# Patient Record
Sex: Male | Born: 1970
Health system: Southern US, Community
[De-identification: ages and names within clinical notes are randomized; demographics above are authoritative.]

## PROBLEM LIST (undated history)

## (undated) DIAGNOSIS — E785 Hyperlipidemia, unspecified: Secondary | ICD-10-CM

## (undated) DIAGNOSIS — T7840XA Allergy, unspecified, initial encounter: Secondary | ICD-10-CM

## (undated) DIAGNOSIS — I10 Essential (primary) hypertension: Secondary | ICD-10-CM

## (undated) HISTORY — DX: Allergy, unspecified, initial encounter: T78.40XA

## (undated) HISTORY — DX: Essential (primary) hypertension: I10

## (undated) HISTORY — DX: Hyperlipidemia, unspecified: E78.5

---

## 2016-02-18 DIAGNOSIS — I10 Essential (primary) hypertension: Secondary | ICD-10-CM | POA: Diagnosis not present

## 2016-02-18 DIAGNOSIS — Z0000 Encounter for general adult medical examination without abnormal findings: Secondary | ICD-10-CM | POA: Diagnosis not present

## 2016-02-18 DIAGNOSIS — Z23 Encounter for immunization: Secondary | ICD-10-CM | POA: Diagnosis not present

## 2017-01-09 DIAGNOSIS — J0140 Acute pansinusitis, unspecified: Secondary | ICD-10-CM | POA: Diagnosis not present

## 2017-01-09 DIAGNOSIS — Z23 Encounter for immunization: Secondary | ICD-10-CM | POA: Diagnosis not present

## 2017-01-09 DIAGNOSIS — J069 Acute upper respiratory infection, unspecified: Secondary | ICD-10-CM | POA: Diagnosis not present

## 2017-10-17 DIAGNOSIS — I8392 Asymptomatic varicose veins of left lower extremity: Secondary | ICD-10-CM | POA: Diagnosis not present

## 2017-10-17 DIAGNOSIS — D171 Benign lipomatous neoplasm of skin and subcutaneous tissue of trunk: Secondary | ICD-10-CM | POA: Diagnosis not present

## 2017-10-18 ENCOUNTER — Other Ambulatory Visit: Payer: Self-pay

## 2017-10-18 DIAGNOSIS — I83812 Varicose veins of left lower extremities with pain: Secondary | ICD-10-CM

## 2017-12-27 DIAGNOSIS — R748 Abnormal levels of other serum enzymes: Secondary | ICD-10-CM | POA: Diagnosis not present

## 2017-12-27 DIAGNOSIS — Z0000 Encounter for general adult medical examination without abnormal findings: Secondary | ICD-10-CM | POA: Diagnosis not present

## 2017-12-27 DIAGNOSIS — E785 Hyperlipidemia, unspecified: Secondary | ICD-10-CM | POA: Diagnosis not present

## 2018-01-03 ENCOUNTER — Other Ambulatory Visit: Payer: Self-pay | Admitting: Family Medicine

## 2018-01-03 DIAGNOSIS — R748 Abnormal levels of other serum enzymes: Secondary | ICD-10-CM

## 2018-01-16 ENCOUNTER — Other Ambulatory Visit: Payer: Self-pay

## 2018-01-23 ENCOUNTER — Ambulatory Visit
Admission: RE | Admit: 2018-01-23 | Discharge: 2018-01-23 | Disposition: A | Discharge status: Home / Self Care | Payer: BLUE CROSS/BLUE SHIELD | Source: Ambulatory Visit | Attending: Family Medicine | Admitting: Family Medicine

## 2018-01-23 DIAGNOSIS — R7989 Other specified abnormal findings of blood chemistry: Secondary | ICD-10-CM | POA: Diagnosis not present

## 2018-01-23 DIAGNOSIS — R748 Abnormal levels of other serum enzymes: Secondary | ICD-10-CM

## 2018-01-28 ENCOUNTER — Encounter: Payer: Self-pay | Admitting: Surgery

## 2018-01-28 ENCOUNTER — Ambulatory Visit: Payer: BLUE CROSS/BLUE SHIELD | Admitting: Surgery

## 2018-01-28 ENCOUNTER — Other Ambulatory Visit: Payer: Self-pay

## 2018-01-28 ENCOUNTER — Ambulatory Visit (HOSPITAL_COMMUNITY)
Admission: RE | Admit: 2018-01-28 | Discharge: 2018-01-28 | Disposition: A | Discharge status: Home / Self Care | Payer: BLUE CROSS/BLUE SHIELD | Source: Ambulatory Visit | Attending: Surgery | Admitting: Surgery

## 2018-01-28 VITALS — BP 154/99 | HR 62 | Temp 97.6°F | Resp 16 | Ht 71.5 in | Wt 191.0 lb

## 2018-01-28 DIAGNOSIS — I83812 Varicose veins of left lower extremities with pain: Secondary | ICD-10-CM

## 2018-01-28 NOTE — Progress Notes (Signed)
[facility] [place]  Patient name: Robert Farmer MRN: [id number] DOB: 1970/07/29 Sex: male   REQUESTING PROVIDER:    Dr. Clarene Duke   REASON FOR CONSULT:    Varicose veins  HISTORY OF PRESENT ILLNESS:   Robert Farmer is a 47 y.o. male, who is here today for evaluation of varicose veins on his left leg.  The patient states that he has been having difficulty for approximately 1.5-2 years.  He has noticed the veins on his leg become more prominent and more tender.  He does endorse swelling in his left leg which is worse at the end of the day.  He has attempted to wear his mother's compression socks but has not noticed significant improvement.  He denies a history of DVT.  There is no family history of clotting disorder.  The patient is a non-smoker.  He is medically managed for hypertension with the beta-blocker.  He has hyperlipidemia which is not requiring medication.  PAST MEDICAL HISTORY    Past Medical History:  Diagnosis Date  . Allergy   . Hyperlipidemia   . Hypertension      FAMILY HISTORY   Family History  Problem Relation Age of Onset  . Stroke Mother     SOCIAL HISTORY:   Social History   Socioeconomic History  . Marital status: Unknown    Spouse name: Not on file  . Number of children: Not on file  . Years of education: Not on file  . Highest education level: Not on file  Occupational History  . Not on file  Social Needs  . Financial resource strain: Not on file  . Food insecurity:    Worry: Not on file    Inability: Not on file  . Transportation needs:    Medical: Not on file    Non-medical: Not on file  Tobacco Use  . Smoking status: Never Smoker  . Smokeless tobacco: Never Used  Substance and Sexual Activity  . Alcohol use: Yes  . Drug use: Never  . Sexual activity: Not on file  Lifestyle  . Physical activity:    Days per week: Not on file    Minutes per session: Not on file  .  Stress: Not on file  Relationships  . Social connections:    Talks on phone: Not on file    Gets together: Not on file    Attends religious service: Not on file    Active member of club or organization: Not on file    Attends meetings of clubs or organizations: Not on file    Relationship status: Not on file  . Intimate partner violence:    Fear of current or ex partner: Not on file    Emotionally abused: Not on file    Physically abused: Not on file    Forced sexual activity: Not on file  Other Topics Concern  . Not on file  Social History Narrative  . Not on file    ALLERGIES:    No Known Allergies  CURRENT MEDICATIONS:    Current Outpatient Medications  Medication Sig Dispense Refill  . ALPRAZolam (XANAX) 0.5 MG tablet Take 0.5 mg by mouth 2 (two) times daily as needed for anxiety.    . metoprolol succinate (TOPROL-XL) 50 MG 24 hr tablet TAKE 1 TABLET BY MOUTH ONCE DAILY FOR 90 DAYS  1  . triamcinolone cream (KENALOG) 0.5 % Apply 1 application topically 2 (two) times daily.     No current  facility-administered medications for this visit.     REVIEW OF SYSTEMS:   [X]  denotes positive finding, [ ]  denotes negative finding Cardiac  Comments:  Chest pain or chest pressure:    Shortness of breath upon exertion:    Short of breath when lying flat:    Irregular heart rhythm:        Vascular    Pain in calf, thigh, or hip brought on by ambulation:    Pain in feet at night that wakes you up from your sleep:     Blood clot in your veins:    Leg swelling:         Pulmonary    Oxygen at home:    Productive cough:     Wheezing:         Neurologic    Sudden weakness in arms or legs:     Sudden numbness in arms or legs:     Sudden onset of difficulty speaking or slurred speech:    Temporary loss of vision in one eye:     Problems with dizziness:         Gastrointestinal    Blood in stool:      Vomited blood:         Genitourinary    Burning when urinating:      Blood in urine:        Psychiatric    Major depression:         Hematologic    Bleeding problems:    Problems with blood clotting too easily:        Skin    Rashes or ulcers:        Constitutional    Fever or chills:     PHYSICAL EXAM:   Vitals:   01/28/18 1242  BP: (!) 154/99  Pulse: 62  Resp: 16  Temp: 97.6 F (36.4 C)  TempSrc: Oral  SpO2: 99%  Weight: 191 lb (86.6 kg)  Height: 5' 11.5" (1.816 m)    GENERAL: The patient is a well-nourished male, in no acute distress. The vital signs are documented above. CARDIAC: There is a regular rate and rhythm.  VASCULAR: Palpable pedal pulses.  Trace edema, left leg.  Prominent varicosities along the tributaries of the great saphenous vein. PULMONARY: Nonlabored respirations ABDOMEN: Soft and non-tender with normal pitched bowel sounds.  MUSCULOSKELETAL: There are no major deformities or cyanosis. NEUROLOGIC: No focal weakness or paresthesias are detected. SKIN: There are no ulcers or rashes noted. PSYCHIATRIC: The patient has a normal affect.  STUDIES:   I have ordered and reviewed his vascular lab studies with the following findings:  Venous Reflux Times Normal value < 0.5 sec +------------------------------+----------+---------+                Right (ms)Left (ms) +------------------------------+----------+---------+ CFV                   1555.00  +------------------------------+----------+---------+ FV                   1680.00  +------------------------------+----------+---------+ Popliteal                924.00   +------------------------------+----------+---------+ GSV at Saphenofemoral junction     3213.00  +------------------------------+----------+---------+ GSV prox thigh             2354.00  +------------------------------+----------+---------+ GSV mid thigh               2398.00  +------------------------------+----------+---------+ GSV dist thigh  1885.00  +------------------------------+----------+---------+ GSV at knee               2105.00  +------------------------------+----------+---------+ GSV prox calf              2178.00  +------------------------------+----------+---------+  Vein Diameters: +------------------------------+----------+---------+                Right (cm)Left (cm) +------------------------------+----------+---------+ GSV at Saphenofemoral junction     1.13    +------------------------------+----------+---------+ GSV at prox thigh            0.73    +------------------------------+----------+---------+ GSV at mid thigh            0.64    +------------------------------+----------+---------+ GSV at distal thigh           0.61    +------------------------------+----------+---------+ GSV at knee               0.60    +------------------------------+----------+---------+ GSV prox calf              0.63    +------------------------------+----------+---------+ GSV mid calf              0.69    +------------------------------+----------+---------+ SSV prox                0.33    +------------------------------+----------+---------+     Summary: Left: Abnormal reflux times were noted in the common femoral vein, femoral vein in the thigh, popliteal vein, great saphenous vein at the saphenofemoral junction, great saphenous vein at the proximal thigh, great saphenous vein at the mid thigh, great  saphenous vein at the distal thigh, great saphenous vein at the knee, and great saphenous vein at the proximal calf. There is no evidence of deep vein thrombosis in the lower extremity.There is no evidence of  superficial venous thrombosis.  ASSESSMENT and PLAN   CEAP class IV: I discussed that the initial treatment recommendations would be for him to wear 20-30 thigh-high compression stockings which he is going to go purchase today.  We also discussed the importance of leg elevation.  He understands to take anti-inflammatories for his pain.  He will be reevaluated in 3 months for consideration of laser vein ablation of the left great saphenous vein and stab phlebectomy of his varicosities.  Durene Cal, MD [facility] [phone] Pager [phone]

## 2018-05-02 ENCOUNTER — Ambulatory Visit: Payer: BLUE CROSS/BLUE SHIELD | Admitting: Vascular Surgery

## 2018-05-30 ENCOUNTER — Ambulatory Visit: Payer: BLUE CROSS/BLUE SHIELD | Admitting: Vascular Surgery

## 2018-06-06 ENCOUNTER — Ambulatory Visit (INDEPENDENT_AMBULATORY_CARE_PROVIDER_SITE_OTHER): Payer: BLUE CROSS/BLUE SHIELD | Admitting: Vascular Surgery

## 2018-06-06 ENCOUNTER — Encounter: Payer: Self-pay | Admitting: Vascular Surgery

## 2018-06-06 ENCOUNTER — Other Ambulatory Visit: Payer: Self-pay

## 2018-06-06 VITALS — BP 146/90 | HR 74 | Temp 97.2°F | Resp 18 | Ht 71.0 in | Wt 194.8 lb

## 2018-06-06 DIAGNOSIS — I83812 Varicose veins of left lower extremities with pain: Secondary | ICD-10-CM | POA: Diagnosis not present

## 2018-06-06 DIAGNOSIS — I872 Venous insufficiency (chronic) (peripheral): Secondary | ICD-10-CM | POA: Diagnosis not present

## 2018-06-06 NOTE — Progress Notes (Signed)
Patient name: Robert Farmer MRN: [id number] DOB: Mar 19, 1971 Sex: male  REASON FOR VISIT:   11-month follow-up visit.  HPI:   Robert Farmer is a pleasant 48 y.o. male who was seen by Dr. Myra Gianotti on 01/28/2018.  Patient had varicose veins of his left leg which were painful.  He also had some swelling which was worse at the end of the day.  He has had no previous history of DVT.  He was put in thigh-high compression stockings with a gradient of 20 to 30 mmHg,, encouraged to elevate his legs, and to take ibuprofen as needed for pain.  His venous duplex scan showed some deep venous reflux involving the common femoral vein, femoral vein, and popliteal vein.  He also had significant superficial venous reflux involving the entire left great saphenous vein.  On my history, the patient states that he has had progressive varicose veins of the left lower extremity over the last year and a half.  He experiences significant aching and heaviness in his legs.  The symptoms are aggravated by standing and sitting and relieved with elevation.  He does a lot of traveling and it experiences symptoms when he is traveling.  He has been wearing his compression stockings without significant relief of his symptoms.  He does try to elevate his legs.  He occasionally takes ibuprofen as needed for pain.  He is unaware of any previous history of DVT or phlebitis.  He is had no previous venous procedures.  He is otherwise very healthy.  Past Medical History:  Diagnosis Date  . Allergy   . Hyperlipidemia   . Hypertension     Family History  Problem Relation Age of Onset  . Stroke Mother     SOCIAL HISTORY: Social History   Tobacco Use  . Smoking status: Never Smoker  . Smokeless tobacco: Never Used  Substance Use Topics  . Alcohol use: Yes    No Known Allergies  Current Outpatient Medications  Medication Sig Dispense Refill  . ALPRAZolam (XANAX) 0.5 MG tablet Take 0.5 mg by mouth 2 (two) times daily as  needed for anxiety.    . metoprolol succinate (TOPROL-XL) 50 MG 24 hr tablet TAKE 1 TABLET BY MOUTH ONCE DAILY FOR 90 DAYS  1  . triamcinolone cream (KENALOG) 0.5 % Apply 1 application topically 2 (two) times daily.     No current facility-administered medications for this visit.     REVIEW OF SYSTEMS:   denotes positive finding,  denotes negative finding Cardiac  Comments:  Chest pain or chest pressure:    Shortness of breath upon exertion:    Short of breath when lying flat:    Irregular heart rhythm:        Vascular    Pain in calf, thigh, or hip brought on by ambulation:    Pain in feet at night that wakes you up from your sleep:     Blood clot in your veins:    Leg swelling:  x       Pulmonary    Oxygen at home:    Productive cough:     Wheezing:         Neurologic    Sudden weakness in arms or legs:     Sudden numbness in arms or legs:     Sudden onset of difficulty speaking or slurred speech:    Temporary loss of vision in one eye:     Problems with dizziness:  Gastrointestinal    Blood in stool:     Vomited blood:         Genitourinary    Burning when urinating:     Blood in urine:        Psychiatric    Major depression:         Hematologic    Bleeding problems:    Problems with blood clotting too easily:        Skin    Rashes or ulcers:        Constitutional    Fever or chills:     PHYSICAL EXAM:   Vitals:   06/06/18 1355  BP: (!) 146/90  Pulse: 74  Resp: 18  Temp: (!) 97.2 F (36.2 C)  TempSrc: Oral  SpO2: 98%  Weight: 194 lb 12.4 oz (88.3 kg)  Height: 5\' 11"  (1.803 m)    GENERAL: The patient is a well-nourished male, in no acute distress. The vital signs are documented above. CARDIAC: There is a regular rate and rhythm.  VASCULAR: I do not detect carotid bruits.  He has palpable popliteal, dorsalis pedis, and posterior tibial pulses bilaterally. VENOUS: He has a large cluster of varicose veins in his medial left calf and  also his posterior left calf. Currently he does not have significant swelling.  I do not see any significant hyperpigmentation. I did look at the left great saphenous vein myself with the SonoSite.  He has significant reflux from the saphenofemoral junction down to below the knee.  The vein becomes superficial below the knee and would likely have to be cannulated at the level of the knee.  There are large tributaries which come off of the great saphenous vein in the proximal calf. PULMONARY: There is good air exchange bilaterally without wheezing or rales. ABDOMEN: Soft and non-tender with normal pitched bowel sounds.  MUSCULOSKELETAL: There are no major deformities or cyanosis. NEUROLOGIC: No focal weakness or paresthesias are detected. SKIN: There are no ulcers or rashes noted. PSYCHIATRIC: The patient has a normal affect.  DATA:    VENOUS DUPLEX: I did review his previous deep venous duplex scan.  He had no evidence of DVT or superficial thrombophlebitis in the left leg.  He did have deep venous reflux involving the common femoral vein, femoral vein, and popliteal vein.  He had superficial venous reflux involving the great saphenous vein from the saphenofemoral junction down to the calf.  The vein is significantly dilated.  MEDICAL ISSUES:   CHRONIC VENOUS INSUFFICIENCY: This patient has CEAP C2 venous disease.  He has significant symptoms and is failed conservative treatment.  I think he is a good candidate for laser ablation of the left great saphenous vein and 10-20 stab phlebectomies.  In addition we have reviewed conservative measures for the treatment of venous disease.  Specifically we have discussed the importance of intermittent leg elevation and the proper positioning for this.  I encouraged him to wear his compression stockings.  I encouraged him to avoid prolonged sitting and standing.  We discussed the importance of exercise specifically walking and water aerobics which is very  helpful for patients with venous disease.  He had maintains a healthy weight which is certainly helpful.  He feels that his symptoms are significantly disabling would like to pursue further treatment.  For this reason we will schedule him for laser ablation of the left great saphenous vein and 10-20 stab phlebectomies.  I have discussed the indications for endovenous laser ablation of the left  GSV, that is to lower the pressure in the veins and potentially help relieve the symptoms from venous hypertension. I have also discussed alternative options including conservative treatment with leg elevation, compression therapy, exercise, avoiding prolonged sitting and standing, and weight management. I have discussed the potential complications of the procedure, including, but not limited to: bleeding, bruising, leg swelling, nerve injury, skin burns, significant pain from phlebitis, deep venous thrombosis, or failure of the vein to close.  I have also explained that venous insufficiency is a chronic disease, and that the patient is at risk for recurrent varicose veins in the future.  All of the patient's questions were encouraged and answered. They are agreeable to proceed.   I have discussed with the patient the indications for stab phlebectomy.  I have explained to the patient that that will have small scars from the stab incisions.  I explained that the other risks include leg swelling, bruising, bleeding, and phlebitis.  All the patient's questions were encouraged and answered and they are agreeable to proceed.  A total of 45 minutes was spent on this visit. 20 minutes was face to face time. More than 50% of the time was spent on counseling and coordinating with the patient.    Waverly Ferrari [facility] [phone]

## 2018-08-26 DIAGNOSIS — R509 Fever, unspecified: Secondary | ICD-10-CM | POA: Diagnosis not present

## 2019-03-05 DIAGNOSIS — J069 Acute upper respiratory infection, unspecified: Secondary | ICD-10-CM | POA: Diagnosis not present

## 2019-03-05 DIAGNOSIS — I10 Essential (primary) hypertension: Secondary | ICD-10-CM | POA: Diagnosis not present

## 2019-03-07 DIAGNOSIS — Z20828 Contact with and (suspected) exposure to other viral communicable diseases: Secondary | ICD-10-CM | POA: Diagnosis not present

## 2019-03-07 DIAGNOSIS — R0981 Nasal congestion: Secondary | ICD-10-CM | POA: Diagnosis not present

## 2019-03-19 DIAGNOSIS — N342 Other urethritis: Secondary | ICD-10-CM | POA: Diagnosis not present

## 2019-03-19 DIAGNOSIS — R509 Fever, unspecified: Secondary | ICD-10-CM | POA: Diagnosis not present

## 2019-03-19 DIAGNOSIS — R6889 Other general symptoms and signs: Secondary | ICD-10-CM | POA: Diagnosis not present

## 2019-03-20 DIAGNOSIS — N3000 Acute cystitis without hematuria: Secondary | ICD-10-CM | POA: Diagnosis not present

## 2019-03-20 DIAGNOSIS — Z20828 Contact with and (suspected) exposure to other viral communicable diseases: Secondary | ICD-10-CM | POA: Diagnosis not present

## 2019-03-20 DIAGNOSIS — R509 Fever, unspecified: Secondary | ICD-10-CM | POA: Diagnosis not present

## 2019-03-22 ENCOUNTER — Emergency Department (HOSPITAL_BASED_OUTPATIENT_CLINIC_OR_DEPARTMENT_OTHER): Payer: BC Managed Care – PPO

## 2019-03-22 ENCOUNTER — Encounter (HOSPITAL_BASED_OUTPATIENT_CLINIC_OR_DEPARTMENT_OTHER): Payer: Self-pay | Admitting: *Deleted

## 2019-03-22 ENCOUNTER — Other Ambulatory Visit: Payer: Self-pay

## 2019-03-22 ENCOUNTER — Inpatient Hospital Stay (HOSPITAL_BASED_OUTPATIENT_CLINIC_OR_DEPARTMENT_OTHER)
Admission: EM | Admit: 2019-03-22 | Discharge: 2019-03-28 | DRG: 872 | Disposition: A | Discharge status: Home Health | Payer: BC Managed Care – PPO | Attending: Internal Medicine | Admitting: Internal Medicine

## 2019-03-22 DIAGNOSIS — N390 Urinary tract infection, site not specified: Secondary | ICD-10-CM | POA: Diagnosis not present

## 2019-03-22 DIAGNOSIS — R519 Headache, unspecified: Secondary | ICD-10-CM | POA: Diagnosis present

## 2019-03-22 DIAGNOSIS — R7989 Other specified abnormal findings of blood chemistry: Secondary | ICD-10-CM | POA: Diagnosis not present

## 2019-03-22 DIAGNOSIS — B9620 Unspecified Escherichia coli [E. coli] as the cause of diseases classified elsewhere: Secondary | ICD-10-CM | POA: Diagnosis present

## 2019-03-22 DIAGNOSIS — A419 Sepsis, unspecified organism: Secondary | ICD-10-CM | POA: Diagnosis present

## 2019-03-22 DIAGNOSIS — F1010 Alcohol abuse, uncomplicated: Secondary | ICD-10-CM | POA: Diagnosis not present

## 2019-03-22 DIAGNOSIS — K760 Fatty (change of) liver, not elsewhere classified: Secondary | ICD-10-CM | POA: Diagnosis present

## 2019-03-22 DIAGNOSIS — N2889 Other specified disorders of kidney and ureter: Secondary | ICD-10-CM

## 2019-03-22 DIAGNOSIS — I10 Essential (primary) hypertension: Secondary | ICD-10-CM | POA: Diagnosis not present

## 2019-03-22 DIAGNOSIS — A4151 Sepsis due to Escherichia coli [E. coli]: Secondary | ICD-10-CM | POA: Diagnosis not present

## 2019-03-22 DIAGNOSIS — R509 Fever, unspecified: Secondary | ICD-10-CM | POA: Diagnosis not present

## 2019-03-22 DIAGNOSIS — N3090 Cystitis, unspecified without hematuria: Secondary | ICD-10-CM | POA: Diagnosis present

## 2019-03-22 DIAGNOSIS — R161 Splenomegaly, not elsewhere classified: Secondary | ICD-10-CM | POA: Diagnosis not present

## 2019-03-22 DIAGNOSIS — Z20828 Contact with and (suspected) exposure to other viral communicable diseases: Secondary | ICD-10-CM | POA: Diagnosis present

## 2019-03-22 DIAGNOSIS — E785 Hyperlipidemia, unspecified: Secondary | ICD-10-CM | POA: Diagnosis present

## 2019-03-22 DIAGNOSIS — Z1612 Extended spectrum beta lactamase (ESBL) resistance: Secondary | ICD-10-CM | POA: Diagnosis not present

## 2019-03-22 DIAGNOSIS — Z823 Family history of stroke: Secondary | ICD-10-CM

## 2019-03-22 DIAGNOSIS — N342 Other urethritis: Secondary | ICD-10-CM | POA: Diagnosis present

## 2019-03-22 DIAGNOSIS — Z79899 Other long term (current) drug therapy: Secondary | ICD-10-CM | POA: Diagnosis not present

## 2019-03-22 LAB — CBC WITH DIFFERENTIAL/PLATELET
Abs Immature Granulocytes: 0.03 10*3/uL (ref 0.00–0.07)
Basophils Absolute: 0.1 10*3/uL (ref 0.0–0.1)
Basophils Relative: 1 %
Eosinophils Absolute: 0 10*3/uL (ref 0.0–0.5)
Eosinophils Relative: 0 %
HCT: 42.8 % (ref 39.0–52.0)
Hemoglobin: 14.4 g/dL (ref 13.0–17.0)
Immature Granulocytes: 0 %
Lymphocytes Relative: 8 %
Lymphs Abs: 0.8 10*3/uL (ref 0.7–4.0)
MCH: 31.9 pg (ref 26.0–34.0)
MCHC: 33.6 g/dL (ref 30.0–36.0)
MCV: 94.7 fL (ref 80.0–100.0)
Monocytes Absolute: 1.8 10*3/uL — ABNORMAL HIGH (ref 0.1–1.0)
Monocytes Relative: 20 %
Neutro Abs: 6.6 10*3/uL (ref 1.7–7.7)
Neutrophils Relative %: 71 %
Platelets: 283 10*3/uL (ref 150–400)
RBC: 4.52 MIL/uL (ref 4.22–5.81)
RDW: 11.9 % (ref 11.5–15.5)
WBC: 9.4 10*3/uL (ref 4.0–10.5)
nRBC: 0 % (ref 0.0–0.2)

## 2019-03-22 LAB — URINALYSIS, ROUTINE W REFLEX MICROSCOPIC
Bilirubin Urine: NEGATIVE
Glucose, UA: NEGATIVE mg/dL
Ketones, ur: NEGATIVE mg/dL
Nitrite: POSITIVE — AB
Protein, ur: >300 mg/dL — AB
Specific Gravity, Urine: >1.03 — ABNORMAL HIGH (ref 1.005–1.030)
pH: 5.5 (ref 5.0–8.0)

## 2019-03-22 LAB — LACTIC ACID, PLASMA: Lactic Acid, Venous: 0.8 mmol/L (ref 0.5–1.9)

## 2019-03-22 LAB — COMPREHENSIVE METABOLIC PANEL
ALT: 126 U/L — ABNORMAL HIGH (ref 0–44)
AST: 116 U/L — ABNORMAL HIGH (ref 15–41)
Albumin: 3.8 g/dL (ref 3.5–5.0)
Alkaline Phosphatase: 91 U/L (ref 38–126)
Anion gap: 10 (ref 5–15)
BUN: 20 mg/dL (ref 6–20)
CO2: 19 mmol/L — ABNORMAL LOW (ref 22–32)
Calcium: 8.7 mg/dL — ABNORMAL LOW (ref 8.9–10.3)
Chloride: 101 mmol/L (ref 98–111)
Creatinine, Ser: 1.38 mg/dL — ABNORMAL HIGH (ref 0.61–1.24)
GFR calc Af Amer: >60 mL/min (ref >60)
GFR calc non Af Amer: >60 mL/min (ref >60)
Glucose, Bld: 120 mg/dL — ABNORMAL HIGH (ref 70–99)
Potassium: 3.6 mmol/L (ref 3.5–5.1)
Sodium: 130 mmol/L — ABNORMAL LOW (ref 135–145)
Total Bilirubin: 0.7 mg/dL (ref 0.3–1.2)
Total Protein: 7.8 g/dL (ref 6.5–8.1)

## 2019-03-22 LAB — URINALYSIS, MICROSCOPIC (REFLEX)
RBC / HPF: >50 RBC/hpf (ref 0–5)
WBC, UA: >50 WBC/hpf (ref 0–5)

## 2019-03-22 LAB — APTT: aPTT: 30 seconds (ref 24–36)

## 2019-03-22 LAB — PROTIME-INR
INR: 1 (ref 0.8–1.2)
Prothrombin Time: 13.5 seconds (ref 11.4–15.2)

## 2019-03-22 MED ORDER — IBUPROFEN 800 MG PO TABS
800.0000 mg | ORAL_TABLET | Freq: Once | ORAL | Status: AC
  Administered 2019-03-22: 800 mg via ORAL
  Filled 2019-03-22: qty 1

## 2019-03-22 MED ORDER — SODIUM CHLORIDE 0.9 % IV BOLUS
1000.0000 mL | Freq: Once | INTRAVENOUS | Status: AC
  Administered 2019-03-22: 1000 mL via INTRAVENOUS

## 2019-03-22 MED ORDER — SODIUM CHLORIDE 0.9 % IV SOLN
1.0000 g | INTRAVENOUS | Status: DC
  Administered 2019-03-22: 1 g via INTRAVENOUS
  Filled 2019-03-22: qty 10

## 2019-03-22 MED ORDER — IOHEXOL 300 MG/ML  SOLN
100.0000 mL | Freq: Once | INTRAMUSCULAR | Status: AC | PRN
  Administered 2019-03-22: 100 mL via INTRAVENOUS

## 2019-03-22 MED ORDER — SODIUM CHLORIDE 0.9 % IV SOLN
1000.0000 mL | INTRAVENOUS | Status: DC
  Administered 2019-03-22: 22:00:00 1000 mL via INTRAVENOUS

## 2019-03-22 MED ORDER — IBUPROFEN 800 MG PO TABS
ORAL_TABLET | ORAL | Status: AC
  Filled 2019-03-22: qty 1

## 2019-03-22 NOTE — ED Notes (Signed)
Dr Miller at bedside. 

## 2019-03-22 NOTE — ED Provider Notes (Signed)
[facility] EMERGENCY DEPARTMENT Provider Note   CSN: [id number] Arrival date & time: 03/22/19  2125     History Chief Complaint  Patient presents with  . Fever    Robert Farmer is a 48 y.o. male.  HPI     This patient is a very pleasant 48 year old male, he has a history of hypertension and hyperlipidemia taking metoprolol and occasional Xanax.  He presents to the hospital this evening with a complaint of fevers.  He reports that over the last several days he has had some persistent fevers as high as 103 at home and improving temporarily with Tylenol and ibuprofen at times.  He reports that he was initially seen through virtual visit by his family doctor office who had prescribed Bactrim for possible prostatitis given a feeling of urgency, dysuria and pressure in the pelvis.  He did not get any better and 2 days ago was switched over to ciprofloxacin.  Unfortunately the urinalysis did show bacteria leukocytes and nitrites however no culture was performed.  He states that today because he started having some lower back discomfort and ongoing fevers he decided to come for further evaluation at the request of a friend who works in the emergency department as a Engineer, civil (consulting).  He does have some lower back discomfort right greater than left.  There is no nausea, no vomiting, no severe weakness but he does have a headache.  He denies any sick contacts at home and has had a Covid test which was negative per the patient's report.  He has never had a urinary infection, he denies any risk for sexually transmitted diseases and has had no discharge from his penis.  At this time he does not have any significant lower abdominal discomfort, pelvic discomfort or problems with bowel movements.  Past Medical History:  Diagnosis Date  . Allergy   . Hyperlipidemia   . Hypertension     There are no problems to display for this patient.   History reviewed. No pertinent surgical  history.     Family History  Problem Relation Age of Onset  . Stroke Mother     Social History   Tobacco Use  . Smoking status: Never Smoker  . Smokeless tobacco: Never Used  Substance Use Topics  . Alcohol use: Yes  . Drug use: Never    Home Medications Prior to Admission medications   Medication Sig Start Date End Date Taking? Authorizing Provider  ciprofloxacin (CIPRO) 250 MG tablet Take 250 mg by mouth 2 (two) times daily. 03/21/19  Yes [provider]  ALPRAZolam Prudy Feeler) 0.5 MG tablet Take 0.5 mg by mouth 2 (two) times daily as needed for anxiety.    [provider]  metoprolol succinate (TOPROL-XL) 50 MG 24 hr tablet TAKE 1 TABLET BY MOUTH ONCE DAILY FOR 90 DAYS 01/16/18   [provider]  triamcinolone cream (KENALOG) 0.5 % Apply 1 application topically 2 (two) times daily.    [provider]    Allergies    Patient has no known allergies.  Review of Systems   Review of Systems  All other systems reviewed and are negative.   Physical Exam Updated Vital Signs BP (!) 139/94   Pulse 85   Temp 99.6 F (37.6 C) (Oral)   Resp 20   Ht 1.803 m (5\' 11" )   Wt 83.9 kg   SpO2 100%   BMI 25.80 kg/m   Physical Exam Vitals and nursing note reviewed.  Constitutional:  General: He is not in acute distress.    Appearance: He is well-developed.  HENT:     Head: Normocephalic and atraumatic.     Mouth/Throat:     Pharynx: No oropharyngeal exudate.  Eyes:     General: No scleral icterus.       Right eye: No discharge.        Left eye: No discharge.     Conjunctiva/sclera: Conjunctivae normal.     Pupils: Pupils are equal, round, and reactive to light.  Neck:     Thyroid: No thyromegaly.     Vascular: No JVD.  Cardiovascular:     Rate and Rhythm: Regular rhythm. Tachycardia present.     Heart sounds: Normal heart sounds. No murmur. No friction rub. No gallop.      Comments: Pulse of 103 Pulmonary:     Effort: Pulmonary  effort is normal. No respiratory distress.     Breath sounds: Normal breath sounds. No wheezing or rales.  Abdominal:     General: Bowel sounds are normal. There is no distension.     Palpations: Abdomen is soft. There is no mass.     Tenderness: There is no abdominal tenderness.     Comments: No significant abdominal tenderness at all  Genitourinary:    Comments: CVA tenderness right greater than left Musculoskeletal:        General: No tenderness. Normal range of motion.     Cervical back: Normal range of motion and neck supple.  Lymphadenopathy:     Cervical: No cervical adenopathy.  Skin:    General: Skin is warm and dry.     Findings: No erythema or rash.  Neurological:     Mental Status: He is alert.     Coordination: Coordination normal.     Comments: Awake alert and following commands without difficulty, ambulatory without difficulty, speech is clear  Psychiatric:        Behavior: Behavior normal.     ED Results / Procedures / Treatments   Labs (all labs ordered are listed, but only abnormal results are displayed) Labs Reviewed  COMPREHENSIVE METABOLIC PANEL - Abnormal; Notable for the following components:      Result Value   Sodium 130 (*)    CO2 19 (*)    Glucose, Bld 120 (*)    Creatinine, Ser 1.38 (*)    Calcium 8.7 (*)    AST 116 (*)    ALT 126 (*)    All other components within normal limits  CBC WITH DIFFERENTIAL/PLATELET - Abnormal; Notable for the following components:   Monocytes Absolute 1.8 (*)    All other components within normal limits  URINALYSIS, ROUTINE W REFLEX MICROSCOPIC - Abnormal; Notable for the following components:   APPearance TURBID (*)    Specific Gravity, Urine >1.030 (*)    Hgb urine dipstick LARGE (*)    Protein, ur >300 (*)    Nitrite POSITIVE (*)    Leukocytes,Ua SMALL (*)    All other components within normal limits  URINALYSIS, MICROSCOPIC (REFLEX) - Abnormal; Notable for the following components:   Bacteria, UA MANY (*)     All other components within normal limits  CULTURE, BLOOD (ROUTINE X 2)  CULTURE, BLOOD (ROUTINE X 2)  LACTIC ACID, PLASMA  APTT  PROTIME-INR  LACTIC ACID, PLASMA  PROCALCITONIN    EKG EKG Interpretation  Date/Time:  Saturday March 22 2019 22:20:24 EST Ventricular Rate:  93 PR Interval:    QRS Duration: 89 QT  Interval:  337 QTC Calculation: 420 R Axis:   28 Text Interpretation: Sinus rhythm Probable left atrial enlargement Normal ECG Confirmed by Noemi Chapel [phone]) on 03/22/2019 11:10:37 PM   Radiology DG Chest Port 1 View  Result Date: 03/22/2019 CLINICAL DATA:  Fever EXAM: PORTABLE CHEST 1 VIEW COMPARISON:  None. FINDINGS: The heart size and mediastinal contours are within normal limits. Both lungs are clear. The visualized skeletal structures are unremarkable. IMPRESSION: No active disease. Electronically Signed   By: Constance Holster M.D.   On: 03/22/2019 22:16    Procedures .Critical Care Performed by: Noemi Chapel, MD Authorized by: Noemi Chapel, MD   Critical care provider statement:    Critical care time (minutes):  35   Critical care time was exclusive of:  Separately billable procedures and treating other patients and teaching time   Critical care was necessary to treat or prevent imminent or life-threatening deterioration of the following conditions:  Sepsis   Critical care was time spent personally by me on the following activities:  Blood draw for specimens, development of treatment plan with patient or surrogate, discussions with consultants, evaluation of patient's response to treatment, examination of patient, obtaining history from patient or surrogate, ordering and performing treatments and interventions, ordering and review of laboratory studies, ordering and review of radiographic studies, pulse oximetry, re-evaluation of patient's condition and review of old charts   (including critical care time)  Medications Ordered in ED Medications   cefTRIAXone (ROCEPHIN) 1 g in sodium chloride 0.9 % 100 mL IVPB (0 g Intravenous Stopped 03/22/19 2311)  0.9 %  sodium chloride infusion (1,000 mLs Intravenous New Bag/Given 03/22/19 2215)  ibuprofen (ADVIL) 800 MG tablet (  Not Given 03/22/19 2238)  iohexol (OMNIPAQUE) 300 MG/ML solution 100 mL (has no administration in time range)  sodium chloride 0.9 % bolus 1,000 mL (1,000 mLs Intravenous New Bag/Given 03/22/19 2215)  ibuprofen (ADVIL) tablet 800 mg (800 mg Oral Given 03/22/19 2228)    ED Course  I have reviewed the triage vital signs and the nursing notes.  Pertinent labs & imaging results that were available during my care of the patient were reviewed by me and considered in my medical decision making (see chart for details).  Clinical Course as of Mar 21 2320  Sat Mar 22, 2019  2212 I have personally viewed the anterior posterior chest x-ray done portable, there is no signs of infiltrate or pneumothorax, the skin and soft tissue structures appear normal, the cardiac silhouette is normal, there is no pneumothorax or subdiaphragmatic air.  This is a normal-appearing chest x-ray.  I agree with radiology   [BM]    Clinical Course User Index [BM] Noemi Chapel, MD   MDM Rules/Calculators/A&P                       This patient does have what appears to be a urinary tract infection.  I have reviewed the results from his prior visit and his urinalysis was definitely consistent with that.  That being said no culture had been performed thus we do not know the bacteria nor the sensitivities to antibiotics.  What we do know is that he has been on both Bactrim and Cipro and has not significantly improved in fact he has worsened and is now having some flank pain that raises concern for pyelonephritis.  Will obtain cultures, code sepsis has been activated, urine culture will be obtained, blood cultures, lactic acid, CBC and metabolic panel.  He will be given 1 L of IV fluids of normal saline and  will be given 1 g of Rocephin per the sepsis order set.  The patient is agreeable to the plan.  CBC shows no leukocytosis or anemia, metabolic panel shows LFT elevation with AST and ALT approximately 115 and 125.  The CO2 is slightly down at 19, sodium of 130 with a normal glucose, creatinine of 1.38 and a BUN of 20.  Lactic acid thankfully is 0.8.  The patient has no signs of coagulopathy however the urinalysis does reveal positive nitrites, 300 protein, leukocytes, and the micro shows greater than 50 white blood cells red blood cells and many bacteria.  This is all consistent with advanced urinary tract disease and unfortunately with flank pain fever he will need a CT scan to rule out a renal abscess or pyelonephritis.  At change of shift, care signed out to Dr. Nicanor Alcon who will follow up results and disposition accordingly.   Final Clinical Impression(s) / ED Diagnoses Final diagnoses:  None    Rx / DC Orders ED Discharge Orders    None       Eber Hong, MD 03/22/19 2321

## 2019-03-22 NOTE — ED Triage Notes (Addendum)
Pt reports fever and difficulty urinating since Tuesday. He was started on Bactrim Wed am after a virtual visit. It was switched Friday am to Cipro. He is concerned b/c the fever is persisting. Also c/o headache. Pt had a negative covid test this week

## 2019-03-23 ENCOUNTER — Encounter (HOSPITAL_BASED_OUTPATIENT_CLINIC_OR_DEPARTMENT_OTHER): Payer: Self-pay | Admitting: Emergency Medicine

## 2019-03-23 DIAGNOSIS — N3090 Cystitis, unspecified without hematuria: Secondary | ICD-10-CM | POA: Diagnosis present

## 2019-03-23 DIAGNOSIS — Z823 Family history of stroke: Secondary | ICD-10-CM | POA: Diagnosis not present

## 2019-03-23 DIAGNOSIS — N2889 Other specified disorders of kidney and ureter: Secondary | ICD-10-CM | POA: Diagnosis not present

## 2019-03-23 DIAGNOSIS — R519 Headache, unspecified: Secondary | ICD-10-CM | POA: Diagnosis present

## 2019-03-23 DIAGNOSIS — Z79899 Other long term (current) drug therapy: Secondary | ICD-10-CM | POA: Diagnosis not present

## 2019-03-23 DIAGNOSIS — E785 Hyperlipidemia, unspecified: Secondary | ICD-10-CM | POA: Diagnosis present

## 2019-03-23 DIAGNOSIS — A419 Sepsis, unspecified organism: Secondary | ICD-10-CM | POA: Diagnosis present

## 2019-03-23 DIAGNOSIS — R7989 Other specified abnormal findings of blood chemistry: Secondary | ICD-10-CM | POA: Diagnosis present

## 2019-03-23 DIAGNOSIS — I10 Essential (primary) hypertension: Secondary | ICD-10-CM | POA: Diagnosis present

## 2019-03-23 DIAGNOSIS — B9620 Unspecified Escherichia coli [E. coli] as the cause of diseases classified elsewhere: Secondary | ICD-10-CM

## 2019-03-23 DIAGNOSIS — F1010 Alcohol abuse, uncomplicated: Secondary | ICD-10-CM | POA: Diagnosis present

## 2019-03-23 DIAGNOSIS — N342 Other urethritis: Secondary | ICD-10-CM | POA: Diagnosis present

## 2019-03-23 DIAGNOSIS — R161 Splenomegaly, not elsewhere classified: Secondary | ICD-10-CM | POA: Diagnosis present

## 2019-03-23 DIAGNOSIS — Z1612 Extended spectrum beta lactamase (ESBL) resistance: Secondary | ICD-10-CM | POA: Diagnosis present

## 2019-03-23 DIAGNOSIS — K760 Fatty (change of) liver, not elsewhere classified: Secondary | ICD-10-CM | POA: Diagnosis present

## 2019-03-23 DIAGNOSIS — A4151 Sepsis due to Escherichia coli [E. coli]: Secondary | ICD-10-CM | POA: Diagnosis present

## 2019-03-23 DIAGNOSIS — Z20828 Contact with and (suspected) exposure to other viral communicable diseases: Secondary | ICD-10-CM | POA: Diagnosis present

## 2019-03-23 DIAGNOSIS — N390 Urinary tract infection, site not specified: Secondary | ICD-10-CM | POA: Diagnosis not present

## 2019-03-23 LAB — CBC
HCT: 43.5 % (ref 39.0–52.0)
Hemoglobin: 14.4 g/dL (ref 13.0–17.0)
MCH: 31.4 pg (ref 26.0–34.0)
MCHC: 33.1 g/dL (ref 30.0–36.0)
MCV: 95 fL (ref 80.0–100.0)
Platelets: 262 10*3/uL (ref 150–400)
RBC: 4.58 MIL/uL (ref 4.22–5.81)
RDW: 12 % (ref 11.5–15.5)
WBC: 12.4 10*3/uL — ABNORMAL HIGH (ref 4.0–10.5)
nRBC: 0 % (ref 0.0–0.2)

## 2019-03-23 LAB — SARS CORONAVIRUS 2 (TAT 6-24 HRS): SARS Coronavirus 2: NEGATIVE

## 2019-03-23 LAB — SARS CORONAVIRUS 2 AG (30 MIN TAT): SARS Coronavirus 2 Ag: NEGATIVE

## 2019-03-23 LAB — PROCALCITONIN: Procalcitonin: 0.22 ng/mL

## 2019-03-23 MED ORDER — ONDANSETRON HCL 4 MG/2ML IJ SOLN: 4.0000 mg | Freq: Four times a day (QID) | INTRAMUSCULAR | Status: DC | PRN

## 2019-03-23 MED ORDER — LORAZEPAM 2 MG/ML IJ SOLN: 1.0000 mg | INTRAMUSCULAR | Status: AC | PRN

## 2019-03-23 MED ORDER — THIAMINE HCL 100 MG/ML IJ SOLN
100.0000 mg | Freq: Every day | INTRAMUSCULAR | Status: DC
  Filled 2019-03-23: qty 2

## 2019-03-23 MED ORDER — OXYCODONE HCL 5 MG PO TABS
5.0000 mg | ORAL_TABLET | ORAL | Status: DC | PRN
  Administered 2019-03-24 – 2019-03-28 (×3): 5 mg via ORAL
  Filled 2019-03-23 (×3): qty 1

## 2019-03-23 MED ORDER — ACETAMINOPHEN 500 MG PO TABS
1000.0000 mg | ORAL_TABLET | Freq: Once | ORAL | Status: AC
  Administered 2019-03-23: 03:00:00 1000 mg via ORAL
  Filled 2019-03-23: qty 2

## 2019-03-23 MED ORDER — TAMSULOSIN HCL 0.4 MG PO CAPS
0.4000 mg | ORAL_CAPSULE | Freq: Two times a day (BID) | ORAL | Status: DC
  Administered 2019-03-23 – 2019-03-28 (×11): 0.4 mg via ORAL
  Filled 2019-03-23 (×12): qty 1

## 2019-03-23 MED ORDER — SODIUM CHLORIDE 0.9% FLUSH
3.0000 mL | Freq: Two times a day (BID) | INTRAVENOUS | Status: DC
  Administered 2019-03-26 – 2019-03-28 (×3): 3 mL via INTRAVENOUS

## 2019-03-23 MED ORDER — TRAZODONE HCL 100 MG PO TABS
100.0000 mg | ORAL_TABLET | Freq: Every day | ORAL | Status: DC
  Administered 2019-03-23 – 2019-03-27 (×5): 100 mg via ORAL
  Filled 2019-03-23 (×5): qty 1

## 2019-03-23 MED ORDER — SODIUM CHLORIDE 0.9 % IV SOLN: 250.0000 mL | INTRAVENOUS | Status: DC | PRN

## 2019-03-23 MED ORDER — METOPROLOL SUCCINATE ER 50 MG PO TB24
50.0000 mg | ORAL_TABLET | Freq: Every day | ORAL | Status: DC
  Administered 2019-03-23 – 2019-03-28 (×6): 50 mg via ORAL
  Filled 2019-03-23 (×7): qty 1

## 2019-03-23 MED ORDER — SODIUM CHLORIDE 0.9 % IV BOLUS
1000.0000 mL | Freq: Once | INTRAVENOUS | Status: AC
  Administered 2019-03-23: 12:00:00 1000 mL via INTRAVENOUS

## 2019-03-23 MED ORDER — ALPRAZOLAM 0.5 MG PO TABS
0.5000 mg | ORAL_TABLET | Freq: Two times a day (BID) | ORAL | Status: DC | PRN
  Administered 2019-03-27 – 2019-03-28 (×2): 0.5 mg via ORAL
  Filled 2019-03-23 (×2): qty 1

## 2019-03-23 MED ORDER — LORAZEPAM 1 MG PO TABS: 1.0000 mg | ORAL_TABLET | ORAL | Status: AC | PRN

## 2019-03-23 MED ORDER — POLYETHYLENE GLYCOL 3350 17 G PO PACK: 17.0000 g | PACK | Freq: Every day | ORAL | Status: DC | PRN

## 2019-03-23 MED ORDER — TRAZODONE HCL 50 MG PO TABS: 50.0000 mg | ORAL_TABLET | Freq: Every evening | ORAL | Status: DC | PRN

## 2019-03-23 MED ORDER — SODIUM CHLORIDE 0.9 % IV SOLN
INTRAVENOUS | Status: DC
  Administered 2019-03-23 – 2019-03-24 (×4): via INTRAVENOUS

## 2019-03-23 MED ORDER — IBUPROFEN 200 MG PO TABS
400.0000 mg | ORAL_TABLET | Freq: Once | ORAL | Status: AC
  Administered 2019-03-23: 400 mg via ORAL
  Filled 2019-03-23: qty 2

## 2019-03-23 MED ORDER — ADULT MULTIVITAMIN W/MINERALS CH
1.0000 | ORAL_TABLET | Freq: Every day | ORAL | Status: DC
  Administered 2019-03-23 – 2019-03-28 (×6): 1 via ORAL
  Filled 2019-03-23 (×7): qty 1

## 2019-03-23 MED ORDER — SODIUM CHLORIDE 0.9% FLUSH: 3.0000 mL | INTRAVENOUS | Status: DC | PRN

## 2019-03-23 MED ORDER — FOLIC ACID 1 MG PO TABS
1.0000 mg | ORAL_TABLET | Freq: Every day | ORAL | Status: DC
  Administered 2019-03-23 – 2019-03-28 (×6): 1 mg via ORAL
  Filled 2019-03-23 (×7): qty 1

## 2019-03-23 MED ORDER — ACETAMINOPHEN 325 MG PO TABS
650.0000 mg | ORAL_TABLET | Freq: Four times a day (QID) | ORAL | Status: DC | PRN
  Administered 2019-03-23 – 2019-03-27 (×8): 650 mg via ORAL
  Filled 2019-03-23 (×9): qty 2

## 2019-03-23 MED ORDER — HEPARIN SODIUM (PORCINE) 5000 UNIT/ML IJ SOLN
5000.0000 [IU] | Freq: Three times a day (TID) | INTRAMUSCULAR | Status: DC
  Administered 2019-03-23 – 2019-03-27 (×9): 5000 [IU] via SUBCUTANEOUS
  Filled 2019-03-23 (×11): qty 1

## 2019-03-23 MED ORDER — SODIUM CHLORIDE 0.9 % IV SOLN
2.0000 g | INTRAVENOUS | Status: DC
  Administered 2019-03-23 – 2019-03-24 (×2): 2 g via INTRAVENOUS
  Filled 2019-03-23: qty 2
  Filled 2019-03-23: qty 20
  Filled 2019-03-23: qty 2

## 2019-03-23 MED ORDER — ACETAMINOPHEN 650 MG RE SUPP: 650.0000 mg | Freq: Four times a day (QID) | RECTAL | Status: DC | PRN

## 2019-03-23 MED ORDER — ONDANSETRON HCL 4 MG PO TABS: 4.0000 mg | ORAL_TABLET | Freq: Four times a day (QID) | ORAL | Status: DC | PRN

## 2019-03-23 MED ORDER — VITAMIN B-1 100 MG PO TABS
100.0000 mg | ORAL_TABLET | Freq: Every day | ORAL | Status: DC
  Administered 2019-03-23 – 2019-03-28 (×6): 100 mg via ORAL
  Filled 2019-03-23 (×7): qty 1

## 2019-03-23 MED ORDER — ALBUTEROL SULFATE (2.5 MG/3ML) 0.083% IN NEBU: 2.5000 mg | INHALATION_SOLUTION | RESPIRATORY_TRACT | Status: DC | PRN

## 2019-03-23 MED ORDER — LABETALOL HCL 5 MG/ML IV SOLN
10.0000 mg | INTRAVENOUS | Status: DC | PRN
  Administered 2019-03-25 – 2019-03-26 (×2): 10 mg via INTRAVENOUS
  Filled 2019-03-23 (×4): qty 4

## 2019-03-23 MED ORDER — DIAZEPAM 2 MG PO TABS
2.0000 mg | ORAL_TABLET | Freq: Three times a day (TID) | ORAL | Status: AC
  Administered 2019-03-23: 22:00:00 2 mg via ORAL
  Filled 2019-03-23: qty 1

## 2019-03-23 MED ORDER — SODIUM CHLORIDE 0.9 % IV SOLN
INTRAVENOUS | Status: DC
  Administered 2019-03-23: 07:00:00 via INTRAVENOUS

## 2019-03-23 MED ORDER — IBUPROFEN 200 MG PO TABS
600.0000 mg | ORAL_TABLET | Freq: Once | ORAL | Status: AC
  Administered 2019-03-23: 600 mg via ORAL
  Filled 2019-03-23: qty 3

## 2019-03-23 NOTE — ED Notes (Signed)
ED TO INPATIENT HANDOFF REPORT  ED Nurse Name and Phone #:   S Name/Age/Gender Robert Farmer 48 y.o. male Room/Bed: MH06/MH06  Code Status   Code Status: Not on file  Home/SNF/Other   Triage Complete: Triage complete  Chief Complaint Sepsis secondary to UTI (HCC) [A41.9, N39.0]  Triage Note Pt reports fever and difficulty urinating since Tuesday. He was started on Bactrim Wed am after a virtual visit. It was switched Friday am to Cipro. He is concerned b/c the fever is persisting. Also c/o headache. Pt had a negative covid test this week    Allergies No Known Allergies  Level of Care/Admitting Diagnosis ED Disposition    ED Disposition Condition Comment   Admit  Hospital Area: [facility] [[id number]]  Level of Care: Telemetry [5]  Admit to tele based on following criteria: Complex arrhythmia (Bradycardia/Tachycardia)  Covid Evaluation: Asymptomatic Screening Protocol (No Symptoms)  Diagnosis: Sepsis secondary to UTI [facility]) [[id number]]  Admitting Physician: Wyvonnia Dusky  Attending Physician: Hillary Bow [phone]  Estimated length of stay: past midnight tomorrow  Certification:: I certify this patient will need inpatient services for at least 2 midnights       B Medical/Surgery History Past Medical History:  Diagnosis Date  . Allergy   . Hyperlipidemia   . Hypertension    History reviewed. No pertinent surgical history.   A IV Location/Drains/Wounds Patient Lines/Drains/Airways Status   Active Line/Drains/Airways    Name:   Placement date:   Placement time:   Site:   Days:   Peripheral IV 03/22/19 Right Forearm   03/22/19    2214    Forearm   1          Intake/Output Last 24 hours  Intake/Output Summary (Last 24 hours) at 03/23/2019 0753 Last data filed at 03/23/2019 8657 Gross per 24 hour  Intake 2100 ml  Output --  Net 2100 ml    Labs/Imaging Results for orders placed or performed during the hospital encounter of  03/22/19 (from the past 48 hour(s))  Lactic acid, plasma     Status: None   Collection Time: 03/22/19 10:04 PM  Result Value Ref Range   Lactic Acid, Venous 0.8 0.5 - 1.9 mmol/L    Comment: Performed at [facility], [address] [place], [place] [address]  Comprehensive metabolic panel     Status: Abnormal   Collection Time: 03/22/19 10:04 PM  Result Value Ref Range   Sodium 130 (L) 135 - 145 mmol/L   Potassium 3.6 3.5 - 5.1 mmol/L   Chloride 101 98 - 111 mmol/L   CO2 19 (L) 22 - 32 mmol/L   Glucose, Bld 120 (H) 70 - 99 mg/dL   BUN 20 6 - 20 mg/dL   Creatinine, Ser 2.95 (H) 0.61 - 1.24 mg/dL   Calcium 8.7 (L) 8.9 - 10.3 mg/dL   Total Protein 7.8 6.5 - 8.1 g/dL   Albumin 3.8 3.5 - 5.0 g/dL   AST 284 (H) 15 - 41 U/L   ALT 126 (H) 0 - 44 U/L   Alkaline Phosphatase 91 38 - 126 U/L   Total Bilirubin 0.7 0.3 - 1.2 mg/dL   GFR calc non Af Amer >60 >60 mL/min   GFR calc Af Amer >60 >60 mL/min   Anion gap 10 5 - 15    Comment: Performed at [facility], [address] [place], [place] [address]  CBC WITH DIFFERENTIAL  Status: Abnormal   Collection Time: 03/22/19 10:04 PM  Result Value Ref Range   WBC 9.4 4.0 - 10.5 K/uL   RBC 4.52 4.22 - 5.81 MIL/uL   Hemoglobin 14.4 13.0 - 17.0 g/dL   HCT 42.8 39.0 - 52.0 %   MCV 94.7 80.0 - 100.0 fL   MCH 31.9 26.0 - 34.0 pg   MCHC 33.6 30.0 - 36.0 g/dL   RDW 11.9 11.5 - 15.5 %   Platelets 283 150 - 400 K/uL   nRBC 0.0 0.0 - 0.2 %   Neutrophils Relative % 71 %   Neutro Abs 6.6 1.7 - 7.7 K/uL   Lymphocytes Relative 8 %   Lymphs Abs 0.8 0.7 - 4.0 K/uL   Monocytes Relative 20 %   Monocytes Absolute 1.8 (H) 0.1 - 1.0 K/uL   Eosinophils Relative 0 %   Eosinophils Absolute 0.0 0.0 - 0.5 K/uL   Basophils Relative 1 %   Basophils Absolute 0.1 0.0 - 0.1 K/uL   Immature Granulocytes 0 %   Abs Immature Granulocytes 0.03 0.00 - 0.07 K/uL    Comment: Performed at [facility], [address] [place],  [place] [address]  APTT     Status: None   Collection Time: 03/22/19 10:04 PM  Result Value Ref Range   aPTT 30 24 - 36 seconds    Comment: Performed at [facility], [address] [place], [place] [address]  Protime-INR     Status: None   Collection Time: 03/22/19 10:04 PM  Result Value Ref Range   Prothrombin Time 13.5 11.4 - 15.2 seconds   INR 1.0 0.8 - 1.2    Comment: (NOTE) INR goal varies based on device and disease states. Performed at [facility], [place], [address] [place], [place] [address]   Urinalysis, Routine w reflex microscopic     Status: Abnormal   Collection Time: 03/22/19 10:04 PM  Result Value Ref Range   Color, Urine YELLOW YELLOW   APPearance TURBID (A) CLEAR   Specific Gravity, Urine >1.030 (H) 1.005 - 1.030   pH 5.5 5.0 - 8.0   Glucose, UA NEGATIVE NEGATIVE mg/dL   Hgb urine dipstick LARGE (A) NEGATIVE   Bilirubin Urine NEGATIVE NEGATIVE   Ketones, ur NEGATIVE NEGATIVE mg/dL   Protein, ur >300 (A) NEGATIVE mg/dL   Nitrite POSITIVE (A) NEGATIVE   Leukocytes,Ua SMALL (A) NEGATIVE    Comment: Performed at [facility], [address] [place], [place] [address]  Procalcitonin     Status: None   Collection Time: 03/22/19 10:04 PM  Result Value Ref Range   Procalcitonin 0.22 ng/mL    Comment:        Interpretation: PCT (Procalcitonin) <= 0.5 ng/mL: Systemic infection (sepsis) is not likely. Local bacterial infection is possible. (NOTE)       Sepsis PCT Algorithm           Lower Respiratory Tract                                      Infection PCT Algorithm    ----------------------------     ----------------------------         PCT < 0.25 ng/mL                PCT < 0.10 ng/mL  Strongly encourage             Strongly discourage   discontinuation of antibiotics    initiation of antibiotics    ----------------------------     -----------------------------       PCT 0.25 - 0.50 ng/mL            PCT 0.10 - 0.25 ng/mL                OR       >80% decrease in PCT            Discourage initiation of                                            antibiotics      Encourage discontinuation           of antibiotics    ----------------------------     -----------------------------         PCT >= 0.50 ng/mL              PCT 0.26 - 0.50 ng/mL               AND        <80% decrease in PCT             Encourage initiation of                                             antibiotics       Encourage continuation           of antibiotics    ----------------------------     -----------------------------        PCT >= 0.50 ng/mL                  PCT > 0.50 ng/mL               AND         increase in PCT                  Strongly encourage                                      initiation of antibiotics    Strongly encourage escalation           of antibiotics                                     -----------------------------                                           PCT <= 0.25 ng/mL                                                 OR                                        >  80% decrease in PCT                                     Discontinue / Do not initiate                                             antibiotics Performed at [facility], [address], [place], [place] [address]   Urinalysis, Microscopic (reflex)     Status: Abnormal   Collection Time: 03/22/19 10:04 PM  Result Value Ref Range   RBC / HPF >50 0 - 5 RBC/hpf   WBC, UA >50 0 - 5 WBC/hpf   Bacteria, UA MANY (A) NONE SEEN   Squamous Epithelial / LPF 0-5 0 - 5   WBC Clumps PRESENT     Comment: Performed at [facility], [address] [place], [place] [address]  SARS Coronavirus 2 Ag (30 min TAT) - Nasopharyngeal Swab     Status: None   Collection Time: 03/23/19  1:40 AM   Specimen: Nasopharyngeal Swab; Nasal Swab  Result Value Ref Range   SARS Coronavirus 2 Ag NEGATIVE NEGATIVE    Comment: (NOTE) SARS-CoV-2 antigen NOT DETECTED.   Negative results are presumptive.  Negative results do not preclude SARS-CoV-2 infection and should not be used as the sole basis for treatment or other patient management decisions, including infection  control decisions, particularly in the presence of clinical signs and  symptoms consistent with COVID-19, or in those who have been in contact with the virus.  Negative results must be combined with clinical observations, patient history, and epidemiological information. The expected result is Negative. Fact Sheet for Patients: https://sanders-williams.net/ Fact Sheet for Healthcare Providers: https://martinez.com/ This test is not yet approved or cleared by the [place] FDA and  has been authorized for detection and/or diagnosis of SARS-CoV-2 by FDA under an Emergency Use Authorization (EUA).  This EUA will remain in effect (meaning this test can be used) for the duration of  the COVID-19 de claration under Section 564(b)(1) of the Act, 21 U.S.C. section 360bbb-3(b)(1), unless the authorization is terminated or revoked sooner. Performed at [facility], [address] [place], [place] [address]    CT ABDOMEN PELVIS W CONTRAST  Result Date: 03/22/2019 CLINICAL DATA:  Fever and difficulty urinating since Tuesday. EXAM: CT ABDOMEN AND PELVIS WITH CONTRAST TECHNIQUE: Multidetector CT imaging of the abdomen and pelvis was performed using the standard protocol following bolus administration of intravenous contrast. CONTRAST:  OMNIPAQUE IOHEXOL 300 MG/ML  SOLN COMPARISON:  None. FINDINGS: Lower chest: The lung bases are clear. The heart size is normal. Hepatobiliary: There is decreased hepatic attenuation suggestive of hepatic steatosis. Normal gallbladder.There is no biliary ductal dilation. Pancreas: Normal contours without ductal dilatation. No peripancreatic fluid collection. Spleen: Spleen is borderline enlarged. Adrenals/Urinary Tract:  --Adrenal glands: No adrenal hemorrhage. --Right kidney/ureter: There is mild right-sided collecting system dilatation with diffuse wall thickening and enhancement of the entire length of the right ureter. There is surrounding inflammatory changes. --Left kidney/ureter: There is mild collecting system dilatation with diffuse enhancement and wall thickening of the entire left ureter with adjacent fat stranding. --Urinary bladder: The bladder is moderately distended. There is diffuse bladder wall thickening. Stomach/Bowel: --Stomach/Duodenum: No hiatal hernia or  other gastric abnormality. Normal duodenal course and caliber. --Small bowel: No dilatation or inflammation. --Colon: No focal abnormality. --Appendix: Normal. Vascular/Lymphatic: Atherosclerotic calcification is present within the non-aneurysmal abdominal aorta, without hemodynamically significant stenosis. --No retroperitoneal lymphadenopathy. --No mesenteric lymphadenopathy. --No pelvic or inguinal lymphadenopathy. Reproductive: The prostate gland is significantly enlarged. Other: No ascites or free air. The abdominal wall is normal. Musculoskeletal. No acute displaced fractures. IMPRESSION: 1. Bilateral ureteritis and cystitis. No definite CT evidence for pyelonephritis. No radiopaque kidney stones. 2. Markedly enlarged prostate gland. 3. Hepatic steatosis. 4. Borderline splenomegaly. Aortic Atherosclerosis (ICD10-I70.0). Electronically Signed   By: Katherine Mantlehristopher  Green M.D.   On: 03/22/2019 23:49   DG Chest Port 1 View  Result Date: 03/22/2019 CLINICAL DATA:  Fever EXAM: PORTABLE CHEST 1 VIEW COMPARISON:  None. FINDINGS: The heart size and mediastinal contours are within normal limits. Both lungs are clear. The visualized skeletal structures are unremarkable. IMPRESSION: No active disease. Electronically Signed   By: Katherine Mantlehristopher  Green M.D.   On: 03/22/2019 22:16    Pending Labs Unresulted Labs (From admission, onward)    Start     Ordered    03/23/19 0159  Culture, Urine  ONCE - STAT,   STAT     03/23/19 0159   03/23/19 0131  SARS CORONAVIRUS 2 (TAT 6-24 HRS) Nasopharyngeal Nasopharyngeal Swab  (Tier 3 (TAT 6-24 hrs))  Once,   STAT    Question Answer Comment  Is this test for diagnosis or screening Screening   Symptomatic for COVID-19 as defined by CDC No   Hospitalized for COVID-19 No   Admitted to ICU for COVID-19 No   Previously tested for COVID-19 No   Resident in a congregate (group) care setting No   Employed in healthcare setting No      03/23/19 0130   03/22/19 2153  Blood Culture (routine x 2)  BLOOD CULTURE X 2,   STAT     03/22/19 2153          Vitals/Pain Today's Vitals   03/23/19 0600 03/23/19 0609 03/23/19 0630 03/23/19 0730  BP: 138/86  139/89 140/89  Pulse: 78  81 79  Resp: 19  12 18   Temp:      TempSrc:      SpO2: 99%  100% 96%  Weight:      Height:      PainSc:  0-No pain      Isolation Precautions No active isolations  Medications Medications  cefTRIAXone (ROCEPHIN) 1 g in sodium chloride 0.9 % 100 mL IVPB (0 g Intravenous Stopped 03/22/19 2311)  0.9 %  sodium chloride infusion (0 mLs Intravenous Stopped 03/23/19 0643)  ibuprofen (ADVIL) 800 MG tablet (  Not Given 03/22/19 2238)  0.9 %  sodium chloride infusion ( Intravenous New Bag/Given 03/23/19 [id number])  sodium chloride 0.9 % bolus 1,000 mL (0 mLs Intravenous Stopped 03/23/19 0010)  ibuprofen (ADVIL) tablet 800 mg (800 mg Oral Given 03/22/19 2228)  iohexol (OMNIPAQUE) 300 MG/ML solution 100 mL (100 mLs Intravenous Contrast Given 03/22/19 2331)  acetaminophen (TYLENOL) tablet 1,000 mg (1,000 mg Oral Given 03/23/19 0247)    Mobility  Low fall risk   Focused Assessments    R Recommendations: See Admitting Provider Note  [facility]  Additional Notes:

## 2019-03-23 NOTE — Plan of Care (Signed)
UTI, failed outpt bactrim and then outpt cipro.  Now has fever 102.x.  UA looks markedly infected and CT abd/pelvis confirms cystitis, B uretitis, no definite pyelonephritis.  COVID pending but no respiratory symptoms and urine looks pretty impressive for having been on 2 outpt ABx courses.  Also does have urinary symptoms.  Rocephin in ED, IVF.  Putting in for IP tele.

## 2019-03-23 NOTE — H&P (Signed)
Patient Demographics:    Robert Farmer, is a 48 y.o. male  MRN: [id number]   DOB - 02/04/1971  Admit Date - 03/22/2019  Outpatient Primary MD for the patient is Hulan Fess, MD   Assessment & Plan:    Principal Problem:   Sepsis secondary to UTI [facility] [address]) Active Problems:   Acute lower UTI   HTN (hypertension)   ETOH abuse   Elevated LFTs    1) sepsis secondary to presumed urinary source--patient met sepsis criteria on admission with a temp of 102.8, tachycardia up to 105, tachypnea with respiratory rate of 25, and WBC of 12.4 -No lactic acidosis -Patient received IV fluid boluses,, heart rate improved with IV fluids -Change Rocephin to 2 g every 24 hours pending blood and urine cultures -PTA was treated with Bactrim for couple days and Cipro for couple days  2) cystitis and ureteritis--- leading to sepsis--treat as above #1 -Clinically suspect  Rt sided pyelonephritis  3) BPH with LUTs-Flomax as ordered, imaging studies with enlarged prostate with need outpatient follow-up with urology  4) HTN--continue metoprolol as BP allows  5) alcohol abuse--- patient admits to drinking at least a quart of liquor daily--- last alcoholic intake monitor to 12 hours ago due to feeling ill and sick from presumed UTI -Lorazepam from per CIWA protocol, folic acid/thiamine multivitamin as ordered  6)Elevated LFTs----pattern of elevation is not consistent with p.o. EtOH hepatitis, check acute viral hepatitis profile -CT abdomen and pelvis with hepatic steatosis and borderline splenomegaly -  With History of - Reviewed by me  Past Medical History:  Diagnosis Date  . Allergy   . Hyperlipidemia   . Hypertension       History reviewed. No pertinent surgical history.    Chief Complaint  Patient presents with  .  Fever      HPI:    Robert Farmer  is a 48 y.o. male with past medical history relevant for HTN and EtOH abuse who presents from [facility] chills with  headaches, fatigue, body aches, persistent fevers, back pain and nausea since around 03/18/2019 -Patient had a telehealth visit and was prescribed Bactrim on 03/19/2019 -He started Cipro on 03/21/2019 after office visit on 03/20/2019-- Despite Cipro and Bactrim as above fevers, dysuria, urinary frequency, urgency as well as back pain and malaise/fatigue persistent -- -fevers  above 102 despite taking Aleve at home -in ED today his urine is strongly suggestive of UTI, -CT abdomen and pelvis with evidence of ureteritis and cystitis without frank pyelonephritis, patient also has enlarged prostate -Patient has nausea but no vomiting, no diarrhea, -WBC was 12.4, procalcitonin 0.22, lactic acid not elevated, chemistry with a sodium of 130, bicarb 19, glucose 120, creatinine 1.38 with a BUN of 20  -LFTs remarkable for AST of 116 and ALT of 126   Review of systems:    In addition to the HPI above,   A full Review of  Systems was done, all other systems reviewed are negative except as noted above in HPI , .    Social History:  Reviewed by me    Social History   Tobacco Use  . Smoking status: Never Smoker  . Smokeless tobacco: Never Used  Substance Use Topics  . Alcohol use: Yes     Family History :  Reviewed by me    Family History  Problem Relation Age of Onset  . Stroke Mother      Home Medications:   Prior to Admission medications   Medication Sig Start Date End Date Taking? Authorizing Provider  acetaminophen (TYLENOL) 500 MG tablet Take 1,000 mg by mouth every 6 (six) hours as needed for mild pain or headache.   Yes [provider]  ALPRAZolam Duanne Moron) 0.5 MG tablet Take 0.5 mg by mouth 2 (two) times daily as needed for anxiety.   Yes [provider]  ciprofloxacin (CIPRO) 250 MG  tablet Take 250 mg by mouth 2 (two) times daily. 03/21/19  Yes [provider]  ibuprofen (ADVIL) 200 MG tablet Take 400 mg by mouth every 6 (six) hours as needed for fever, headache or moderate pain.   Yes [provider]  metoprolol succinate (TOPROL-XL) 50 MG 24 hr tablet Take 50 mg by mouth daily.  01/16/18  Yes [provider]  naproxen (NAPROSYN) 375 MG tablet Take 375-750 mg by mouth 2 (two) times daily as needed for mild pain.   Yes [provider]  polyvinyl alcohol (LIQUIFILM TEARS) 1.4 % ophthalmic solution Place 1 drop into both eyes as needed for dry eyes.   Yes [provider]  sulfamethoxazole-trimethoprim (BACTRIM DS) 800-160 MG tablet Take 1 tablet by mouth 2 (two) times daily. 14 day supply 03/19/19   [provider]     Allergies:    No Known Allergies   Physical Exam:   Vitals  Blood pressure (!) 135/98, pulse 79, temperature (!) 102.8 F (39.3 C), temperature source Oral, resp. rate 20, height '5\' 11"'$  (1.803 m), weight 83.9 kg, SpO2 100 %.  Temp:  [98.4 F (36.9 C)-102.8 F (39.3 C)] 102.8 F (39.3 C) (12/13 1201) Pulse Rate:  [73-105] 98 (12/13 1204) Resp:  [12-25] 20 (12/13 1204) BP: (106-155)/(79-101) 141/89 (12/13 1204) SpO2:  [96 %-100 %] 99 % (12/13 1204) Weight:  [83.9 kg] 83.9 kg (12/12 2139)   Physical Examination: General appearance - alert, well appearing, and in no distress (does not appear toxic) Mental status - alert, oriented to person, place, and time,  Eyes - sclera anicteric Neck - supple, no JVD elevation , Chest - clear  to auscultation bilaterally, symmetrical air movement,  Heart - S1 and S2 normal, regular  Abdomen - soft,nondistended, right CVA area tenderness Neurological - screening mental status exam normal, neck supple without rigidity, cranial nerves II through XII intact, DTR's normal and symmetric Extremities - no pedal edema noted, intact peripheral pulses  Skin - warm,  dry     Data Review:    CBC Recent Labs  Lab 03/22/19 2204 03/23/19 0958  WBC 9.4 12.4*  HGB 14.4 14.4  HCT 42.8 43.5  PLT 283 262  MCV 94.7 95.0  MCH 31.9 31.4  MCHC 33.6 33.1  RDW 11.9 12.0  LYMPHSABS 0.8  --   MONOABS 1.8*  --   EOSABS 0.0  --   BASOSABS 0.1  --    ------------------------------------------------------------------------------------------------------------------  Chemistries  Recent Labs  Lab 03/22/19 2204  NA 130*  K 3.6  CL 101  CO2 19*  GLUCOSE 120*  BUN 20  CREATININE 1.38*  CALCIUM 8.7*  AST 116*  ALT 126*  ALKPHOS 91  BILITOT 0.7   ------------------------------------------------------------------------------------------------------------------ estimated creatinine clearance is 69.7 mL/min (A) (by C-G formula based on SCr of 1.38 mg/dL (H)). ------------------------------------------------------------------------------------------------------------------ No results for input(s): TSH, T4TOTAL, T3FREE, THYROIDAB in the last 72 hours.  Invalid input(s): FREET3   Coagulation profile Recent Labs  Lab 03/22/19 2204  INR 1.0   ------------------------------------------------------------------------------------------------------------------- No results for input(s): DDIMER in the last 72 hours. -------------------------------------------------------------------------------------------------------------------  Cardiac Enzymes No results for input(s): CKMB, TROPONINI, MYOGLOBIN in the last 168 hours.  Invalid input(s): CK ------------------------------------------------------------------------------------------------------------------ No results found for: BNP   ---------------------------------------------------------------------------------------------------------------  Urinalysis    Component Value Date/Time   COLORURINE YELLOW 03/22/2019 2204   APPEARANCEUR TURBID (A) 03/22/2019 2204   LABSPEC >1.030 (H) 03/22/2019 2204    PHURINE 5.5 03/22/2019 2204   GLUCOSEU NEGATIVE 03/22/2019 2204   HGBUR LARGE (A) 03/22/2019 2204   BILIRUBINUR NEGATIVE 03/22/2019 2204   KETONESUR NEGATIVE 03/22/2019 2204   PROTEINUR >300 (A) 03/22/2019 2204   NITRITE POSITIVE (A) 03/22/2019 2204   LEUKOCYTESUR SMALL (A) 03/22/2019 2204    ----------------------------------------------------------------------------------------------------------------   Imaging Results:    CT ABDOMEN PELVIS W CONTRAST  Result Date: 03/22/2019 CLINICAL DATA:  Fever and difficulty urinating since Tuesday. EXAM: CT ABDOMEN AND PELVIS WITH CONTRAST TECHNIQUE: Multidetector CT imaging of the abdomen and pelvis was performed using the standard protocol following bolus administration of intravenous contrast. CONTRAST:  180m OMNIPAQUE IOHEXOL 300 MG/ML  SOLN COMPARISON:  None. FINDINGS: Lower chest: The lung bases are clear. The heart size is normal. Hepatobiliary: There is decreased hepatic attenuation suggestive of hepatic steatosis. Normal gallbladder.There is no biliary ductal dilation. Pancreas: Normal contours without ductal dilatation. No peripancreatic fluid collection. Spleen: Spleen is borderline enlarged. Adrenals/Urinary Tract: --Adrenal glands: No adrenal hemorrhage. --Right kidney/ureter: There is mild right-sided collecting system dilatation with diffuse wall thickening and enhancement of the entire length of the right ureter. There is surrounding inflammatory changes. --Left kidney/ureter: There is mild collecting system dilatation with diffuse enhancement and wall thickening of the entire left ureter with adjacent fat stranding. --Urinary bladder: The bladder is moderately distended. There is diffuse bladder wall thickening. Stomach/Bowel: --Stomach/Duodenum: No hiatal hernia or other gastric abnormality. Normal duodenal course and caliber. --Small bowel: No dilatation or inflammation. --Colon: No focal abnormality. --Appendix: Normal.  Vascular/Lymphatic: Atherosclerotic calcification is present within the non-aneurysmal abdominal aorta, without hemodynamically significant stenosis. --No retroperitoneal lymphadenopathy. --No mesenteric lymphadenopathy. --No pelvic or inguinal lymphadenopathy. Reproductive: The prostate gland is significantly enlarged. Other: No ascites or free air. The abdominal wall is normal. Musculoskeletal. No acute displaced fractures. IMPRESSION: 1. Bilateral ureteritis and cystitis. No definite CT evidence for pyelonephritis. No radiopaque kidney stones. 2. Markedly enlarged prostate gland. 3. Hepatic steatosis. 4. Borderline splenomegaly. Aortic Atherosclerosis (ICD10-I70.0). Electronically Signed   By: CConstance HolsterM.D.   On: 03/22/2019 23:49   DG Chest Port 1 View  Result Date: 03/22/2019 CLINICAL DATA:  Fever EXAM: PORTABLE CHEST 1 VIEW COMPARISON:  None. FINDINGS: The heart size and mediastinal contours are within normal limits. Both lungs are clear. The visualized skeletal structures are unremarkable. IMPRESSION: No active disease. Electronically Signed   By: CConstance HolsterM.D.   On: 03/22/2019 22:16    Radiological Exams on Admission: CT ABDOMEN PELVIS W CONTRAST  Result Date: 03/22/2019 CLINICAL DATA:  Fever and difficulty urinating since Tuesday. EXAM: CT ABDOMEN  AND PELVIS WITH CONTRAST TECHNIQUE: Multidetector CT imaging of the abdomen and pelvis was performed using the standard protocol following bolus administration of intravenous contrast. CONTRAST:  175m OMNIPAQUE IOHEXOL 300 MG/ML  SOLN COMPARISON:  None. FINDINGS: Lower chest: The lung bases are clear. The heart size is normal. Hepatobiliary: There is decreased hepatic attenuation suggestive of hepatic steatosis. Normal gallbladder.There is no biliary ductal dilation. Pancreas: Normal contours without ductal dilatation. No peripancreatic fluid collection. Spleen: Spleen is borderline enlarged. Adrenals/Urinary Tract: --Adrenal  glands: No adrenal hemorrhage. --Right kidney/ureter: There is mild right-sided collecting system dilatation with diffuse wall thickening and enhancement of the entire length of the right ureter. There is surrounding inflammatory changes. --Left kidney/ureter: There is mild collecting system dilatation with diffuse enhancement and wall thickening of the entire left ureter with adjacent fat stranding. --Urinary bladder: The bladder is moderately distended. There is diffuse bladder wall thickening. Stomach/Bowel: --Stomach/Duodenum: No hiatal hernia or other gastric abnormality. Normal duodenal course and caliber. --Small bowel: No dilatation or inflammation. --Colon: No focal abnormality. --Appendix: Normal. Vascular/Lymphatic: Atherosclerotic calcification is present within the non-aneurysmal abdominal aorta, without hemodynamically significant stenosis. --No retroperitoneal lymphadenopathy. --No mesenteric lymphadenopathy. --No pelvic or inguinal lymphadenopathy. Reproductive: The prostate gland is significantly enlarged. Other: No ascites or free air. The abdominal wall is normal. Musculoskeletal. No acute displaced fractures. IMPRESSION: 1. Bilateral ureteritis and cystitis. No definite CT evidence for pyelonephritis. No radiopaque kidney stones. 2. Markedly enlarged prostate gland. 3. Hepatic steatosis. 4. Borderline splenomegaly. Aortic Atherosclerosis (ICD10-I70.0). Electronically Signed   By: CConstance HolsterM.D.   On: 03/22/2019 23:49   DG Chest Port 1 View  Result Date: 03/22/2019 CLINICAL DATA:  Fever EXAM: PORTABLE CHEST 1 VIEW COMPARISON:  None. FINDINGS: The heart size and mediastinal contours are within normal limits. Both lungs are clear. The visualized skeletal structures are unremarkable. IMPRESSION: No active disease. Electronically Signed   By: CConstance HolsterM.D.   On: 03/22/2019 22:16    DVT Prophylaxis -SCD  /heparin AM Labs Ordered, also please review Full Orders  Family  Communication: Admission, patients condition and plan of care including tests being ordered have been discussed with the patient who indicate understanding and agree with the plan   Code Status - Full Code  Likely DC to  home  Condition   Stable  CRoxan HockeyM.D on 03/23/2019 at 12:08 PM Go to [email or web address] -  for contact info  [facility] - Office  [phone]

## 2019-03-23 NOTE — ED Notes (Signed)
[facility] notified (Tammy) - patient ready for transport 

## 2019-03-23 NOTE — Progress Notes (Signed)
Patient arrived from [facility]. Patient alert and oriented x4.  Vital signs stable. Placed on tele. Buyer, retail paged for orders.

## 2019-03-23 NOTE — ED Notes (Signed)
Nurse for RM [id number] aware that patient has left and is on his way over to her via [facility].

## 2019-03-24 DIAGNOSIS — F1010 Alcohol abuse, uncomplicated: Secondary | ICD-10-CM

## 2019-03-24 DIAGNOSIS — N390 Urinary tract infection, site not specified: Secondary | ICD-10-CM | POA: Diagnosis not present

## 2019-03-24 DIAGNOSIS — R7989 Other specified abnormal findings of blood chemistry: Secondary | ICD-10-CM

## 2019-03-24 DIAGNOSIS — N2889 Other specified disorders of kidney and ureter: Secondary | ICD-10-CM | POA: Diagnosis not present

## 2019-03-24 DIAGNOSIS — A419 Sepsis, unspecified organism: Secondary | ICD-10-CM

## 2019-03-24 DIAGNOSIS — B9620 Unspecified Escherichia coli [E. coli] as the cause of diseases classified elsewhere: Secondary | ICD-10-CM | POA: Diagnosis not present

## 2019-03-24 DIAGNOSIS — R519 Headache, unspecified: Secondary | ICD-10-CM | POA: Diagnosis not present

## 2019-03-24 LAB — COMPREHENSIVE METABOLIC PANEL
ALT: 80 U/L — ABNORMAL HIGH (ref 0–44)
AST: 45 U/L — ABNORMAL HIGH (ref 15–41)
Albumin: 3.2 g/dL — ABNORMAL LOW (ref 3.5–5.0)
Alkaline Phosphatase: 74 U/L (ref 38–126)
Anion gap: 8 (ref 5–15)
BUN: 11 mg/dL (ref 6–20)
CO2: 21 mmol/L — ABNORMAL LOW (ref 22–32)
Calcium: 8.5 mg/dL — ABNORMAL LOW (ref 8.9–10.3)
Chloride: 109 mmol/L (ref 98–111)
Creatinine, Ser: 0.99 mg/dL (ref 0.61–1.24)
GFR calc Af Amer: >60 mL/min (ref >60)
GFR calc non Af Amer: >60 mL/min (ref >60)
Glucose, Bld: 99 mg/dL (ref 70–99)
Potassium: 3.9 mmol/L (ref 3.5–5.1)
Sodium: 138 mmol/L (ref 135–145)
Total Bilirubin: 1.1 mg/dL (ref 0.3–1.2)
Total Protein: 6.4 g/dL — ABNORMAL LOW (ref 6.5–8.1)

## 2019-03-24 LAB — CBC
HCT: 39.4 % (ref 39.0–52.0)
Hemoglobin: 13 g/dL (ref 13.0–17.0)
MCH: 31.4 pg (ref 26.0–34.0)
MCHC: 33 g/dL (ref 30.0–36.0)
MCV: 95.2 fL (ref 80.0–100.0)
Platelets: 249 10*3/uL (ref 150–400)
RBC: 4.14 MIL/uL — ABNORMAL LOW (ref 4.22–5.81)
RDW: 12 % (ref 11.5–15.5)
WBC: 9.1 10*3/uL (ref 4.0–10.5)
nRBC: 0 % (ref 0.0–0.2)

## 2019-03-24 LAB — LIPID PANEL
Cholesterol: 143 mg/dL (ref 0–200)
HDL: 26 mg/dL — ABNORMAL LOW (ref >40)
LDL Cholesterol: 88 mg/dL (ref 0–99)
Total CHOL/HDL Ratio: 5.5 RATIO
Triglycerides: 147 mg/dL (ref <150)
VLDL: 29 mg/dL (ref 0–40)

## 2019-03-24 LAB — MAGNESIUM: Magnesium: 2.2 mg/dL (ref 1.7–2.4)

## 2019-03-24 LAB — MRSA PCR SCREENING: MRSA by PCR: NEGATIVE

## 2019-03-24 LAB — HEPATITIS PANEL, ACUTE
HCV Ab: NONREACTIVE
Hep A IgM: NONREACTIVE
Hep B C IgM: NONREACTIVE
Hepatitis B Surface Ag: NONREACTIVE

## 2019-03-24 LAB — PHOSPHORUS: Phosphorus: 3.5 mg/dL (ref 2.5–4.6)

## 2019-03-24 LAB — HIV ANTIBODY (ROUTINE TESTING W REFLEX): HIV Screen 4th Generation wRfx: NONREACTIVE

## 2019-03-24 MED ORDER — DIPHENHYDRAMINE HCL 50 MG/ML IJ SOLN
25.0000 mg | Freq: Once | INTRAMUSCULAR | Status: AC
  Administered 2019-03-24: 14:00:00 25 mg via INTRAVENOUS
  Filled 2019-03-24: qty 1

## 2019-03-24 MED ORDER — IBUPROFEN 200 MG PO TABS
400.0000 mg | ORAL_TABLET | Freq: Once | ORAL | Status: AC
  Administered 2019-03-24: 22:00:00 400 mg via ORAL
  Filled 2019-03-24: qty 2

## 2019-03-24 MED ORDER — MAGNESIUM SULFATE 2 GM/50ML IV SOLN
2.0000 g | Freq: Once | INTRAVENOUS | Status: AC
  Administered 2019-03-24: 2 g via INTRAVENOUS
  Filled 2019-03-24 (×2): qty 50

## 2019-03-24 MED ORDER — KETOROLAC TROMETHAMINE 15 MG/ML IJ SOLN
15.0000 mg | Freq: Once | INTRAMUSCULAR | Status: AC
  Administered 2019-03-24: 14:00:00 15 mg via INTRAVENOUS
  Filled 2019-03-24: qty 1

## 2019-03-24 MED ORDER — VANCOMYCIN HCL 10 G IV SOLR
2000.0000 mg | Freq: Once | INTRAVENOUS | Status: AC
  Administered 2019-03-24: 2000 mg via INTRAVENOUS
  Filled 2019-03-24: qty 2000

## 2019-03-24 MED ORDER — ACETAMINOPHEN 500 MG PO TABS
1000.0000 mg | ORAL_TABLET | Freq: Once | ORAL | Status: AC
  Administered 2019-03-24: 01:00:00 1000 mg via ORAL
  Filled 2019-03-24: qty 2

## 2019-03-24 MED ORDER — VANCOMYCIN HCL IN DEXTROSE 1-5 GM/200ML-% IV SOLN
1000.0000 mg | Freq: Two times a day (BID) | INTRAVENOUS | Status: DC
  Administered 2019-03-24 – 2019-03-25 (×2): 1000 mg via INTRAVENOUS
  Filled 2019-03-24 (×2): qty 200

## 2019-03-24 MED ORDER — VANCOMYCIN HCL IN DEXTROSE 1-5 GM/200ML-% IV SOLN: 1000.0000 mg | Freq: Two times a day (BID) | INTRAVENOUS | Status: DC

## 2019-03-24 NOTE — Progress Notes (Signed)
Pharmacy Antibiotic Note  Robert Farmer is a 48 y.o. male admitted on 03/22/2019 with UTI.  Pharmacy has been consulted for vancomcyin dosing.  Plan: Rocephin 2 Gm IV q24h (MD) Vancomycin 2 Gm x1 then 1 Gm IV q12h for est AUC = 531 Goal AUC = 400-550 F/u scr/cultures/levels  Height: 5\' 11"  (180.3 cm) Weight: 185 lb (83.9 kg) IBW/kg (Calculated) : 75.3  Temp (24hrs), Avg:100.6 F (38.1 C), Min:98 F (36.7 C), Max:102.8 F (39.3 C)  Recent Labs  Lab 03/22/19 2204 03/23/19 0958  WBC 9.4 12.4*  CREATININE 1.38*  --   LATICACIDVEN 0.8  --     Estimated Creatinine Clearance: 69.7 mL/min (A) (by C-G formula based on SCr of 1.38 mg/dL (H)).    No Known Allergies  Antimicrobials this admission: 12/13 rocephin >>  12/14 vancomycin >>   Dose adjustments this admission:   Microbiology results:  BCx:   UCx:    Sputum:    MRSA PCR:   Thank you for allowing pharmacy to be a part of this patient's care.  Robert Farmer 03/24/2019 12:59 AM

## 2019-03-24 NOTE — Progress Notes (Signed)
Patient spiked a 102.7 temperature - gave advil - will recheck at 2230 - also, patient complains of headache since last Tuesday: Last Tuesday was also the last time he has had a coffee (usually drinks two cups a day) as well as the last time he has had any liquor. I recommended that patient drink a cup of coffee to see if the headache eases - explained due to lack of caffeine that he is use to getting could be the root cause of the headache.

## 2019-03-24 NOTE — Progress Notes (Signed)
PROGRESS NOTE    Robert Farmer  OXB:[id number] DOB: [birth year] DOA: 03/22/2019 PCP: Robert Fess, MD    Brief Narrative:  48 year old male with history of alcohol abuse admitted with UTI  Assessment & Plan:   Principal Problem:   Sepsis secondary to UTI [name]) Active Problems:   Acute lower UTI   HTN (hypertension)   ETOH abuse   Elevated LFTs  Sepsis secondary to UTI pain  CT abdomen pelvis showing bilateral urethritis and cystitis but no CT evidence of pyelonephritis or radiopaque renal stones No prior history of renal stones or recurrent UTIs Urine culture growing E. coli, susceptibilities pending.  Per patient urine culture at [facility] urgent care is growing  MDR bug per the provider at urgent care.  Attempted more than once to get urine culture sensitivity results from [facility] urgent care but unsuccessful.  Care everywhere does not display sensitivity results. No recent hospitalizations or history of antibiotic use other than Bactrim/ciprofloxacin for this UTI Given persistent fevers and concern for MDR E. coli, will add vancomycin to Rocephin Pending urine culture sensitivity for this hospitalization Continue IV fluids  Alcohol abuse Counseled on importance of cessation CIWA protocol Schedule diazepam for now Thiamine, folic acid  Elevated LFT Not consistent with either alcohol-related hepatitis Acute hepatitis panel negative, HIV negative Likely in setting of sepsis  Hypertension Stable, continue home meds  Headache Likely multifactorial No obvious neurological deficit Tylenol as needed.  Will give one-time IV magnesium, Benadryl, Toradol  DVT prophylaxis: Heparin SQ Code Status: Full code  Family Communication: Discussed care with patient and wife at bedside in detail Disposition Plan: Pending medical stability, discharge destination Home   Consultants:   None  Procedures:  None  Antimicrobials:   Vancomycin,  ceftriaxone   Subjective: Febrile overnight.  Patient reports persistent right flank pain and concerned about persistent fever.  Says he got a call by the provider at urgent care that the bacteria in his urine is a MDR.  Discussed will attempt to get sensitivity records from [place] urgent care.  Complains of a headache.  Otherwise no complaints.  Denies symptoms of alcohol withdrawal. Objective: Vitals:   03/24/19 1336 03/24/19 1358 03/24/19 1454 03/24/19 1752  BP:  (!) 145/85  (!) 138/92  Pulse:  81  70  Resp:  18  18  Temp: (!) 101.6 F (38.7 C) (!) 100.4 F (38 C) (!) 100.7 F (38.2 C) 99.3 F (37.4 C)  TempSrc: Oral Oral Oral Oral  SpO2:  96%  98%  Weight:      Height:        Intake/Output Summary (Last 24 hours) at 03/24/2019 1956 Last data filed at 03/24/2019 1830 Gross per 24 hour  Intake 3072.5 ml  Output --  Net 3072.5 ml   Filed Weights   03/22/19 2139  Weight: 83.9 kg    Examination:  General exam: Appears calm and comfortable  Respiratory system: Clear to auscultation. Respiratory effort normal. Cardiovascular system: S1 & S2 heard, RRR. No JVD, murmurs, rubs, gallops or clicks. No pedal edema. Gastrointestinal system: Abdomen is nondistended, soft and nontender. No organomegaly or masses felt. Normal bowel sounds heard.  Mild right CVA tenderness Central nervous system: Alert and oriented. No focal neurological deficits. Extremities: Symmetric 5 x 5 power. Skin: No rashes, lesions or ulcers Psychiatry: Judgement and insight appear normal. Mood & affect appropriate.     Data Reviewed: I have personally reviewed following labs and imaging studies  CBC: Recent Labs  Lab 03/22/19  2204 03/23/19 0958 03/24/19 0605  WBC 9.4 12.4* 9.1  NEUTROABS 6.6  --   --   HGB 14.4 14.4 13.0  HCT 42.8 43.5 39.4  MCV 94.7 95.0 95.2  PLT 283 262 249   Basic Metabolic Panel: Recent Labs  Lab 03/22/19 2204 03/24/19 0605  NA 130* 138  K 3.6 3.9  CL 101 109   CO2 19* 21*  GLUCOSE 120* 99  BUN 20 11  CREATININE 1.38* 0.99  CALCIUM 8.7* 8.5*  MG  --  2.2  PHOS  --  3.5   GFR: Estimated Creatinine Clearance: 97.2 mL/min (by C-G formula based on SCr of 0.99 mg/dL). Liver Function Tests: Recent Labs  Lab 03/22/19 2204 03/24/19 0605  AST 116* 45*  ALT 126* 80*  ALKPHOS 91 74  BILITOT 0.7 1.1  PROT 7.8 6.4*  ALBUMIN 3.8 3.2*   No results for input(s): LIPASE, AMYLASE in the last 168 hours. No results for input(s): AMMONIA in the last 168 hours. Coagulation Profile: Recent Labs  Lab 03/22/19 2204  INR 1.0   Cardiac Enzymes: No results for input(s): CKTOTAL, CKMB, CKMBINDEX, TROPONINI in the last 168 hours. BNP (last 3 results) No results for input(s): PROBNP in the last 8760 hours. HbA1C: No results for input(s): HGBA1C in the last 72 hours. CBG: No results for input(s): GLUCAP in the last 168 hours. Lipid Profile: Recent Labs    03/24/19 0605  CHOL 143  HDL 26*  LDLCALC 88  TRIG [id number]  CHOLHDL 5.5   Thyroid Function Tests: No results for input(s): TSH, T4TOTAL, FREET4, T3FREE, THYROIDAB in the last 72 hours. Anemia Panel: No results for input(s): VITAMINB12, FOLATE, FERRITIN, TIBC, IRON, RETICCTPCT in the last 72 hours. Sepsis Labs: Recent Labs  Lab 03/22/19 2204  PROCALCITON 0.22  LATICACIDVEN 0.8    Recent Results (from the past 240 hour(s))  Blood Culture (routine x 2)     Status: None (Preliminary result)   Collection Time: 03/22/19 10:04 PM   Specimen: BLOOD RIGHT ARM  Result Value Ref Range Status   Specimen Description   Final    BLOOD RIGHT ARM Performed at [facility] [place], [address] [place], [place] [address]    Special Requests   Final    BOTTLES DRAWN AEROBIC AND ANAEROBIC Blood Culture adequate volume Performed at [facility] [place], [address] [place], [place] [address]    Culture   Final    NO GROWTH 1 DAY Performed at [facility], [address]  [place], [place] [address]    Report Status PENDING  Incomplete  Blood Culture (routine x 2)     Status: None (Preliminary result)   Collection Time: 03/22/19 10:04 PM   Specimen: BLOOD LEFT ARM  Result Value Ref Range Status   Specimen Description   Final    BLOOD LEFT ARM Performed at [facility] [place], [address] [place], [place] [address]    Special Requests   Final    BOTTLES DRAWN AEROBIC AND ANAEROBIC Blood Culture adequate volume Performed at [facility] [place], [address] [place], [place] [address]    Culture   Final    NO GROWTH 1 DAY Performed at [facility], [address] [place], [place] [address]    Report Status PENDING  Incomplete  Culture, Urine     Status: Abnormal (Preliminary result)   Collection Time: 03/22/19 10:04 PM   Specimen:  Urine, Random  Result Value Ref Range Status   Specimen Description   Final    URINE, RANDOM Performed at [facility], [address] [place], [place] [address]    Special Requests   Final    NONE Performed at [facility], [address] [place], [place] [address]    Culture (A)  Final    >=100,000 COLONIES/mL ESCHERICHIA COLI IDENTIFICATION AND SUSCEPTIBILITIES TO FOLLOW Performed at [facility], [address], [place], [place] [address]    Report Status PENDING  Incomplete  SARS Coronavirus 2 Ag (30 min TAT) - Nasopharyngeal Swab     Status: None   Collection Time: 03/23/19  1:40 AM   Specimen: Nasopharyngeal Swab; Nasal Swab  Result Value Ref Range Status   SARS Coronavirus 2 Ag NEGATIVE NEGATIVE Final    Comment: (NOTE) SARS-CoV-2 antigen NOT DETECTED.  Negative results are presumptive.  Negative results do not preclude SARS-CoV-2 infection and should not be used as the sole basis for treatment or other patient management decisions, including infection  control decisions, particularly in the presence of clinical signs and  symptoms consistent with COVID-19, or  in those who have been in contact with the virus.  Negative results must be combined with clinical observations, patient history, and epidemiological information. The expected result is Negative. Fact Sheet for Patients: https://sanders-williams.net/ Fact Sheet for Healthcare Providers: https://martinez.com/ This test is not yet approved or cleared by the [place] FDA and  has been authorized for detection and/or diagnosis of SARS-CoV-2 by FDA under an Emergency Use Authorization (EUA).  This EUA will remain in effect (meaning this test can be used) for the duration of  the COVID-19 de claration under Section 564(b)(1) of the Act, 21 U.S.C. section 360bbb-3(b)(1), unless the authorization is terminated or revoked sooner. Performed at [facility], [address] [place], [place] [address]   SARS CORONAVIRUS 2 (TAT 6-24 HRS) Nasopharyngeal Nasopharyngeal Swab     Status: None   Collection Time: 03/23/19  2:50 AM   Specimen: Nasopharyngeal Swab  Result Value Ref Range Status   SARS Coronavirus 2 NEGATIVE NEGATIVE Final    Comment: (NOTE) SARS-CoV-2 target nucleic acids are NOT DETECTED. The SARS-CoV-2 RNA is generally detectable in upper and lower respiratory specimens during the acute phase of infection. Negative results do not preclude SARS-CoV-2 infection, do not rule out co-infections with other pathogens, and should not be used as the sole basis for treatment or other patient management decisions. Negative results must be combined with clinical observations, patient history, and epidemiological information. The expected result is Negative. Fact Sheet for Patients: HairSlick.no Fact Sheet for Healthcare Providers: quierodirigir.com This test is not yet approved or cleared by the [place] FDA and  has been authorized for detection and/or diagnosis of SARS-CoV-2 by FDA  under an Emergency Use Authorization (EUA). This EUA will remain  in effect (meaning this test can be used) for the duration of the COVID-19 declaration under Section 56 4(b)(1) of the Act, 21 U.S.C. section 360bbb-3(b)(1), unless the authorization is terminated or revoked sooner. Performed at [facility], [address] [place], [place] [address]   MRSA PCR Screening     Status: None   Collection Time: 03/24/19 12:54 AM   Specimen: Nasal Mucosa; Nasopharyngeal  Result Value Ref Range Status   MRSA by PCR NEGATIVE NEGATIVE Final    Comment:        The  GeneXpert MRSA Assay (FDA approved for NASAL specimens only), is one component of a comprehensive MRSA colonization surveillance program. It is not intended to diagnose MRSA infection nor to guide or monitor treatment for MRSA infections. Performed at [facility], [address], [place], [place] [address]          Radiology Studies: CT ABDOMEN PELVIS W CONTRAST  Result Date: 03/22/2019 CLINICAL DATA:  Fever and difficulty urinating since Tuesday. EXAM: CT ABDOMEN AND PELVIS WITH CONTRAST TECHNIQUE: Multidetector CT imaging of the abdomen and pelvis was performed using the standard protocol following bolus administration of intravenous contrast. CONTRAST:  OMNIPAQUE IOHEXOL 300 MG/ML  SOLN COMPARISON:  None. FINDINGS: Lower chest: The lung bases are clear. The heart size is normal. Hepatobiliary: There is decreased hepatic attenuation suggestive of hepatic steatosis. Normal gallbladder.There is no biliary ductal dilation. Pancreas: Normal contours without ductal dilatation. No peripancreatic fluid collection. Spleen: Spleen is borderline enlarged. Adrenals/Urinary Tract: --Adrenal glands: No adrenal hemorrhage. --Right kidney/ureter: There is mild right-sided collecting system dilatation with diffuse wall thickening and enhancement of the entire length of the right ureter. There is surrounding  inflammatory changes. --Left kidney/ureter: There is mild collecting system dilatation with diffuse enhancement and wall thickening of the entire left ureter with adjacent fat stranding. --Urinary bladder: The bladder is moderately distended. There is diffuse bladder wall thickening. Stomach/Bowel: --Stomach/Duodenum: No hiatal hernia or other gastric abnormality. Normal duodenal course and caliber. --Small bowel: No dilatation or inflammation. --Colon: No focal abnormality. --Appendix: Normal. Vascular/Lymphatic: Atherosclerotic calcification is present within the non-aneurysmal abdominal aorta, without hemodynamically significant stenosis. --No retroperitoneal lymphadenopathy. --No mesenteric lymphadenopathy. --No pelvic or inguinal lymphadenopathy. Reproductive: The prostate gland is significantly enlarged. Other: No ascites or free air. The abdominal wall is normal. Musculoskeletal. No acute displaced fractures. IMPRESSION: 1. Bilateral ureteritis and cystitis. No definite CT evidence for pyelonephritis. No radiopaque kidney stones. 2. Markedly enlarged prostate gland. 3. Hepatic steatosis. 4. Borderline splenomegaly. Aortic Atherosclerosis (ICD10-I70.0). Electronically Signed   By: Katherine Mantle M.D.   On: 03/22/2019 23:49   DG Chest Port 1 View  Result Date: 03/22/2019 CLINICAL DATA:  Fever EXAM: PORTABLE CHEST 1 VIEW COMPARISON:  None. FINDINGS: The heart size and mediastinal contours are within normal limits. Both lungs are clear. The visualized skeletal structures are unremarkable. IMPRESSION: No active disease. Electronically Signed   By: Katherine Mantle M.D.   On: 03/22/2019 22:16        Scheduled Meds: . diazepam  2 mg Oral TID  . folic acid  1 mg Oral Daily  . heparin  5,000 Units Subcutaneous Q8H  . metoprolol succinate  50 mg Oral Daily  . multivitamin with minerals  1 tablet Oral Daily  . sodium chloride flush  3 mL Intravenous Q12H  . tamsulosin  0.4 mg Oral BID  .  thiamine  100 mg Oral Daily   Or  . thiamine  100 mg Intravenous Daily  . traZODone  100 mg Oral QHS   Continuous Infusions: . sodium chloride    . sodium chloride 125 mL/hr at 03/24/19 1337  . cefTRIAXone (ROCEPHIN)  IV 2 g (03/24/19 1108)  . vancomycin 1,000 mg (03/24/19 1611)     LOS: 1 day    Time spent: Spent more than 30 minutes in coordinating care for this patient including bedside patient care.    Liborio Nixon, MD [facility] If 7PM-7AM, please contact night-coverage 03/24/2019, 7:56 PM

## 2019-03-25 DIAGNOSIS — R519 Headache, unspecified: Secondary | ICD-10-CM

## 2019-03-25 DIAGNOSIS — B9620 Unspecified Escherichia coli [E. coli] as the cause of diseases classified elsewhere: Secondary | ICD-10-CM

## 2019-03-25 LAB — URINE CULTURE: Culture: >=100000 — AB

## 2019-03-25 MED ORDER — SODIUM CHLORIDE 0.9 % IV SOLN
1.0000 g | Freq: Three times a day (TID) | INTRAVENOUS | Status: AC
  Administered 2019-03-25 – 2019-03-28 (×9): 1 g via INTRAVENOUS
  Filled 2019-03-25 (×9): qty 1

## 2019-03-25 MED ORDER — BUTALBITAL-APAP-CAFFEINE 50-325-40 MG PO TABS
2.0000 | ORAL_TABLET | Freq: Four times a day (QID) | ORAL | Status: DC | PRN
  Administered 2019-03-25 – 2019-03-28 (×9): 2 via ORAL
  Filled 2019-03-25 (×9): qty 2

## 2019-03-25 NOTE — Progress Notes (Signed)
PROGRESS NOTE    Robert Farmer  ZOX:[id number] DOB: July 30, 1970 DOA: 03/22/2019 PCP: Catha Gosselin, MD    Brief Narrative:  48 year old male with history of alcohol abuse admitted with UTI  Assessment & Plan:   Principal Problem:   Sepsis secondary to UTI [facility]) Active Problems:   Acute lower UTI   HTN (hypertension)   ETOH abuse   Elevated LFTs   Ureteritis  ESBL E. coli UTI CT abdomen pelvis showing bilateral urethritis and cystitis but no CT evidence of pyelonephritis or radiopaque renal stones No prior history of renal stones or recurrent UTIs No recent hospitalizations or history of antibiotic use other than Bactrim/ciprofloxacin for this UTI Blood cultures no growth to date Antibiotics changed to meropenem starting 03/25/2019.  Duration of antibiotics likely 14 days  We will plan for PICC line placement and discuss options for outpatient antibiotic administration with management Continue IV fluids for now  Alcohol abuse Counseled on importance of cessation CIWA protocol Thiamine, folic acid  Elevated LFT Not consistent with either alcohol-related hepatitis Acute hepatitis panel negative, HIV negative Likely in setting of sepsis  Hypertension Stable, continue home meds  Headache Likely multifactorial. No obvious neurological deficit. Improved somewhat with IV magnesium, Benadryl, Toradol but did not completely resolve and has returned with the same severity Tylenol as needed.  Will attempt trial of Fioricet  DVT prophylaxis: Heparin SQ Code Status: Full code  Family Communication: Discussed care with patient and wife via telephone in detail Disposition Plan: Pending medical stability, discharge destination Home   Consultants:   None  Procedures:  None  Antimicrobials:   Meropenem   Subjective: Febrile overnight.  Reports continued headache and requesting something for pain.  Denies any blurred vision or focal weakness.  Wife is concerned  about his headache and is asking if he needs imaging.  Discussed given normal neurological exam, at this time imaging is not indicated but if his headache does not resolve at the time of discharge, we may consider imaging.   Objective: Vitals:   03/24/19 2252 03/25/19 0038 03/25/19 0556 03/25/19 1244  BP:   (!) 146/83 (!) 154/89  Pulse:   74 (!) 53  Resp:   16 13  Temp: 100 F (37.8 C) 98 F (36.7 C) 99.9 F (37.7 C) 98.6 F (37 C)  TempSrc: Oral Oral Oral Oral  SpO2:   100% 97%  Weight:      Height:        Intake/Output Summary (Last 24 hours) at 03/25/2019 1843 Last data filed at 03/25/2019 0509 Gross per 24 hour  Intake 1818.18 ml  Output --  Net 1818.18 ml   Filed Weights   03/22/19 2139  Weight: 83.9 kg    Examination:  General exam: Appears calm and comfortable  Respiratory system: Clear to auscultation. Respiratory effort normal. Cardiovascular system: S1 & S2 heard, RRR. No JVD, murmurs, rubs, gallops or clicks. No pedal edema. Gastrointestinal system: Abdomen is nondistended, soft and nontender. No organomegaly or masses felt. Normal bowel sounds heard.   Central nervous system: Alert and oriented. No focal neurological deficits.  Cranial nerves II to XII grossly normal.  Able to perform finger-to-nose test and heel-to-shin test on both sides with no difficulty.  No pronator drift.  Strength intact in all extremities.  Station intact in all extremities.  Face symmetrical. Extremities: Symmetric 5 x 5 power. Skin: No rashes, lesions or ulcers Psychiatry: Judgement and insight appear normal. Mood & affect appropriate.    Data Reviewed:  I have personally reviewed following labs and imaging studies  CBC: Recent Labs  Lab 03/22/19 2204 03/23/19 0958 03/24/19 0605  WBC 9.4 12.4* 9.1  NEUTROABS 6.6  --   --   HGB 14.4 14.4 13.0  HCT 42.8 43.5 39.4  MCV 94.7 95.0 95.2  PLT 283 262 249   Basic Metabolic Panel: Recent Labs  Lab 03/22/19 2204 03/24/19 0605   NA 130* 138  K 3.6 3.9  CL 101 109  CO2 19* 21*  GLUCOSE 120* 99  BUN 20 11  CREATININE 1.38* 0.99  CALCIUM 8.7* 8.5*  MG  --  2.2  PHOS  --  3.5   GFR: Estimated Creatinine Clearance: 97.2 mL/min (by C-G formula based on SCr of 0.99 mg/dL). Liver Function Tests: Recent Labs  Lab 03/22/19 2204 03/24/19 0605  AST 116* 45*  ALT 126* 80*  ALKPHOS 91 74  BILITOT 0.7 1.1  PROT 7.8 6.4*  ALBUMIN 3.8 3.2*   No results for input(s): LIPASE, AMYLASE in the last 168 hours. No results for input(s): AMMONIA in the last 168 hours. Coagulation Profile: Recent Labs  Lab 03/22/19 2204  INR 1.0   Cardiac Enzymes: No results for input(s): CKTOTAL, CKMB, CKMBINDEX, TROPONINI in the last 168 hours. BNP (last 3 results) No results for input(s): PROBNP in the last 8760 hours. HbA1C: No results for input(s): HGBA1C in the last 72 hours. CBG: No results for input(s): GLUCAP in the last 168 hours. Lipid Profile: Recent Labs    03/24/19 0605  CHOL 143  HDL 26*  LDLCALC 88  TRIG 235  CHOLHDL 5.5   Thyroid Function Tests: No results for input(s): TSH, T4TOTAL, FREET4, T3FREE, THYROIDAB in the last 72 hours. Anemia Panel: No results for input(s): VITAMINB12, FOLATE, FERRITIN, TIBC, IRON, RETICCTPCT in the last 72 hours. Sepsis Labs: Recent Labs  Lab 03/22/19 2204  PROCALCITON 0.22  LATICACIDVEN 0.8    Recent Results (from the past 240 hour(s))  Blood Culture (routine x 2)     Status: None (Preliminary result)   Collection Time: 03/22/19 10:04 PM   Specimen: BLOOD RIGHT ARM  Result Value Ref Range Status   Specimen Description   Final    BLOOD RIGHT ARM Performed at [facility], [address] [place], [place] [address]    Special Requests   Final    BOTTLES DRAWN AEROBIC AND ANAEROBIC Blood Culture adequate volume Performed at [facility], [address] [place], [place] [address]    Culture   Final    NO GROWTH 2 DAYS Performed at [facility], [address], [place], [place] [address]    Report Status PENDING  Incomplete  Blood Culture (routine x 2)     Status: None (Preliminary result)   Collection Time: 03/22/19 10:04 PM   Specimen: BLOOD LEFT ARM  Result Value Ref Range Status   Specimen Description   Final    BLOOD LEFT ARM Performed at [facility], [address] [place], [place] [address]    Special Requests   Final    BOTTLES DRAWN AEROBIC AND ANAEROBIC Blood Culture adequate volume Performed at [facility], [address] [place], [place] [address]    Culture   Final    NO GROWTH 2 DAYS Performed at [facility], [address] [place], [place] [address]    Report Status PENDING  Incomplete  Culture, Urine  Status: Abnormal   Collection Time: 03/22/19 10:04 PM   Specimen: Urine, Random  Result Value Ref Range Status   Specimen Description   Final    URINE, RANDOM Performed at [facility] [place], [address] [place], [place] [address]    Special Requests   Final    NONE Performed at [facility] [place], [address] [place], [place] [address]    Culture (A)  Final    >=100,000 COLONIES/mL ESCHERICHIA COLI Confirmed Extended Spectrum Beta-Lactamase Producer (ESBL).  In bloodstream infections from ESBL organisms, carbapenems are preferred over piperacillin/tazobactam. They are shown to have a lower risk of mortality.    Report Status 03/25/2019 FINAL  Final   Organism ID, Bacteria ESCHERICHIA COLI (A)  Final      Susceptibility   Escherichia coli - MIC*    AMPICILLIN >=32 RESISTANT Resistant     CEFAZOLIN >=64 RESISTANT Resistant     CEFTRIAXONE >=64 RESISTANT Resistant     CIPROFLOXACIN >=4 RESISTANT Resistant     GENTAMICIN >=16 RESISTANT Resistant     IMIPENEM <=0.25 SENSITIVE Sensitive     NITROFURANTOIN <=16 SENSITIVE Sensitive     TRIMETH/SULFA >=320 RESISTANT Resistant     AMPICILLIN/SULBACTAM >=32 RESISTANT Resistant      PIP/TAZO 8 SENSITIVE Sensitive     * >=100,000 COLONIES/mL ESCHERICHIA COLI  SARS Coronavirus 2 Ag (30 min TAT) - Nasopharyngeal Swab     Status: None   Collection Time: 03/23/19  1:40 AM   Specimen: Nasopharyngeal Swab; Nasal Swab  Result Value Ref Range Status   SARS Coronavirus 2 Ag NEGATIVE NEGATIVE Final    Comment: (NOTE) SARS-CoV-2 antigen NOT DETECTED.  Negative results are presumptive.  Negative results do not preclude SARS-CoV-2 infection and should not be used as the sole basis for treatment or other patient management decisions, including infection  control decisions, particularly in the presence of clinical signs and  symptoms consistent with COVID-19, or in those who have been in contact with the virus.  Negative results must be combined with clinical observations, patient history, and epidemiological information. The expected result is Negative. Fact Sheet for Patients: https://sanders-williams.net/https://www.fda.gov/media/139754/download Fact Sheet for Healthcare Providers: https://[place].com/https://www.fda.gov/media/139753/download This test is not yet approved or cleared by the [place] FDA and  has been authorized for detection and/or diagnosis of SARS-CoV-2 by FDA under an Emergency Use Authorization (EUA).  This EUA will remain in effect (meaning this test can be used) for the duration of  the COVID-19 de claration under Section 564(b)(1) of the Act, 21 U.S.C. section 360bbb-3(b)(1), unless the authorization is terminated or revoked sooner. Performed at [facility] [place], [address] [place], [place] [address]   SARS CORONAVIRUS 2 (TAT 6-24 HRS) Nasopharyngeal Nasopharyngeal Swab     Status: None   Collection Time: 03/23/19  2:50 AM   Specimen: Nasopharyngeal Swab  Result Value Ref Range Status   SARS Coronavirus 2 NEGATIVE NEGATIVE Final    Comment: (NOTE) SARS-CoV-2 target nucleic acids are NOT DETECTED. The SARS-CoV-2 RNA is generally detectable in upper and lower respiratory specimens  during the acute phase of infection. Negative results do not preclude SARS-CoV-2 infection, do not rule out co-infections with other pathogens, and should not be used as the sole basis for treatment or other patient management decisions. Negative results must be combined with clinical observations, patient history, and epidemiological information. The expected result is Negative. Fact Sheet for Patients: HairSlick.nohttps://www.fda.gov/media/138098/download Fact Sheet for Healthcare Providers: quierodirigir.comhttps://www.fda.gov/media/138095/download This  test is not yet approved or cleared by the [place] and  has been authorized for detection and/or diagnosis of SARS-CoV-2 by FDA under an Emergency Use Authorization (EUA). This EUA will remain  in effect (meaning this test can be used) for the duration of the COVID-19 declaration under Section 56 4(b)(1) of the Act, 21 U.S.C. section 360bbb-3(b)(1), unless the authorization is terminated or revoked sooner. Performed at [facility], [place] [address] [place], [place] [address]   MRSA PCR Screening     Status: None   Collection Time: 03/24/19 12:54 AM   Specimen: Nasal Mucosa; Nasopharyngeal  Result Value Ref Range Status   MRSA by PCR NEGATIVE NEGATIVE Final    Comment:        The GeneXpert MRSA Assay (FDA approved for NASAL specimens only), is one component of a comprehensive MRSA colonization surveillance program. It is not intended to diagnose MRSA infection nor to guide or monitor treatment for MRSA infections. Performed at [facility], [place] [address], [place], [place] [address]          Radiology Studies: No results found.      Scheduled Meds: . folic acid  1 mg Oral Daily  . heparin  5,000 Units Subcutaneous Q8H  . metoprolol succinate  50 mg Oral Daily  . multivitamin with minerals  1 tablet Oral Daily  . sodium chloride flush  3 mL Intravenous Q12H  . tamsulosin  0.4 mg Oral BID  . thiamine   100 mg Oral Daily   Or  . thiamine  100 mg Intravenous Daily  . traZODone  100 mg Oral QHS   Continuous Infusions: . sodium chloride    . sodium chloride 125 mL/hr at 03/25/19 1034  . meropenem (MERREM) IV 1 g (03/25/19 1750)     LOS: 2 days    Time spent: Spent more than 30 minutes in coordinating care for this patient including bedside patient care.   Lucky Cowboy, MD [facility] If 7PM-7AM, please contact night-coverage 03/25/2019, 6:43 PM

## 2019-03-25 NOTE — Progress Notes (Signed)
Pharmacy Antibiotic Note  Robert Farmer is a 48 y.o. male admitted on 03/22/2019 with ESBL EColi.  Pharmacy has been consulted for merrem dosing.  Plan: Merrem 1gm IV q8h Follow renal function  Height: 5\' 11"  (180.3 cm) Weight: 185 lb (83.9 kg) IBW/kg (Calculated) : 75.3  Temp (24hrs), Avg:100.1 F (37.8 C), Min:98 F (36.7 C), Max:102 F (38.9 C)  Recent Labs  Lab 03/22/19 2204 03/23/19 0958 03/24/19 0605  WBC 9.4 12.4* 9.1  CREATININE 1.38*  --  0.99  LATICACIDVEN 0.8  --   --     Estimated Creatinine Clearance: 97.2 mL/min (by C-G formula based on SCr of 0.99 mg/dL).    No Known Allergies  Antimicrobials this admission: 12/13 rocephin >> 12/15 12/14 vancomycin >> 12/15 12/15 merrem >>  Dose adjustments this admission:   Microbiology results: 12/12 BCx: ngtd 12/12 UCx: EColi 12/14 MRSA PCR: neg  Thank you for allowing pharmacy to be a part of this patient's care.  Dolly Rias RPh 03/25/2019, 8:04 AM

## 2019-03-26 MED ORDER — AMLODIPINE BESYLATE 5 MG PO TABS
5.0000 mg | ORAL_TABLET | Freq: Every day | ORAL | Status: DC
  Administered 2019-03-26 – 2019-03-28 (×3): 5 mg via ORAL
  Filled 2019-03-26 (×4): qty 1

## 2019-03-26 MED ORDER — SODIUM CHLORIDE 0.9% FLUSH: 10.0000 mL | INTRAVENOUS | Status: DC | PRN

## 2019-03-26 NOTE — TOC Initial Note (Signed)
Transition of Care [facility]) - Initial/Assessment Note    Patient Details  Name: Robert Farmer MRN: [id number] Date of Birth: February 07, 1971  Transition of Care [facility]) CM/SW Contact:    Trish Mage, LCSW Phone Number: 03/26/2019, 1:19 PM  Clinical Narrative:   TOC was contacted by Jeannene Patella with [facility] with an offer of services. Services accepted.  [facility] will be tapped to provide [facility] RN services. TOC will continue to follow during the course of hospitalization.                 Expected Discharge Plan: [place] Barriers to Discharge: No Barriers Identified   Patient Goals and CMS Choice        Expected Discharge Plan and Services Expected Discharge Plan: [place]                                              Prior Living Arrangements/Services                       Activities of Daily Living Home Assistive Devices/Equipment: None ADL Screening (condition at time of admission) Patient's cognitive ability adequate to safely complete daily activities?: No Is the patient deaf or have difficulty hearing?: No Does the patient have difficulty seeing, even when wearing glasses/contacts?: No Does the patient have difficulty concentrating, remembering, or making decisions?: No Patient able to express need for assistance with ADLs?: No Does the patient have difficulty dressing or bathing?: No Independently performs ADLs?: Yes (appropriate for developmental age) Does the patient have difficulty walking or climbing stairs?: No Weakness of Legs: None Weakness of Arms/Hands: None  Permission Sought/Granted                  Emotional Assessment              Admission diagnosis:  Ureteritis [N28.89] Sepsis secondary to UTI ([place]) [A41.9, N39.0] Patient Active Problem List   Diagnosis Date Noted  . Nonintractable headache   . Ureteritis   . Sepsis secondary to UTI ([place]) 03/23/2019  . E-coli UTI 03/23/2019  .  HTN (hypertension) 03/23/2019  . ETOH abuse 03/23/2019  . Elevated LFTs 03/23/2019   PCP:  Hulan Fess, MD Pharmacy:   [place], [place] [address] Phone: [phone] Fax: [phone]     Social Determinants of Health (SDOH) Interventions    Readmission Risk Interventions No flowsheet data found.

## 2019-03-26 NOTE — Progress Notes (Signed)
PHARMACY CONSULT NOTE FOR:  OUTPATIENT  PARENTERAL ANTIBIOTIC THERAPY (OPAT)  Indication: ESBL UTI Regimen: Merrem 1gm IV q8h End date: 04/07/2019  IV antibiotic discharge orders are pended. To discharging provider:  please sign these orders via discharge navigator,  Select New Orders & click on the button choice - Manage This Unsigned Work.     Thank you for allowing pharmacy to be a part of this patient's care.  Dolly Rias RPh 03/26/2019, 8:23 AM

## 2019-03-26 NOTE — Progress Notes (Signed)
PROGRESS NOTE    Robert Farmer  GMW:[id number] DOB: 02/16/1971 DOA: 03/22/2019 PCP: Catha GosselinLittle, Kevin, MD    Brief Narrative:  48 year old male with history of alcohol abuse admitted with UTI  Assessment & Plan:   Principal Problem:   Sepsis secondary to UTI (HCC) Active Problems:   E-coli UTI   HTN (hypertension)   ETOH abuse   Elevated LFTs   Ureteritis   Nonintractable headache  ESBL E. coli UTI CT abdomen pelvis showing bilateral urethritis and cystitis but no CT evidence of pyelonephritis or radiopaque renal stones No prior history of renal stones or recurrent UTIs.  No history of recent travel to [place]. No recent hospitalizations or history of antibiotic use other than Bactrim/ciprofloxacin for this UTI Blood cultures no growth to date Antibiotics changed to meropenem starting 03/25/2019.  Duration of antibiotics likely 14 days  OPAT consulted We will place midline instead of a PICC line given shorter duration of antibiotics  Alcohol abuse Counseled on importance of cessation CIWA protocol Thiamine, folic acid  Elevated LFT Not consistent with either alcohol-related hepatitis Acute hepatitis panel negative, HIV negative Likely in setting of sepsis  Hypertension Stable, continue home meds  Headache  No obvious neurological deficit.  Suspect caffeine withdrawal headache Continue Fioricet as needed as this was helpful Recommended patient drink 1 to 2 cups of coffee as he normally does.  He is agreeable to this   DVT prophylaxis: Heparin SQ Code Status: Full code  Family Communication: Discussed care with patient  Disposition Plan: Pending medical stability, discharge [facility]    Consultants:   None  Procedures:  None  Antimicrobials:   Meropenem   Subjective: Febrile overnight but less in intensity.  Reports headache relieved with fioricet but came back. Discussed he usually drinks 2 cups of coffee per day and  not had any since admission. Recommended he try drinking coffee for the headache. Otherwise denies any complains.   Objective: Vitals:   03/26/19 0517 03/26/19 0823 03/26/19 1158 03/26/19 1827  BP: (!) 174/100 (!) 147/89 (!) 148/88 (!) 140/97  Pulse: 65 (!) 59 (!) 54 (!) 59  Resp: 16 16 16 20   Temp: 99.6 F (37.6 C) 99.1 F (37.3 C) 98.6 F (37 C) 98.4 F (36.9 C)  TempSrc: Oral Oral Oral Oral  SpO2: 97% 99% 98% 99%  Weight:      Height:        Intake/Output Summary (Last 24 hours) at 03/26/2019 1935 Last data filed at 03/26/2019 1800 Gross per 24 hour  Intake 1735.33 ml  Output --  Net 1735.33 ml   Filed Weights   03/22/19 2139  Weight: 83.9 kg    Examination:  General exam: Appears calm and comfortable  Respiratory system: Clear to auscultation. Respiratory effort normal. Cardiovascular system: S1 & S2 heard, RRR. No JVD, murmurs, rubs, gallops or clicks. No pedal edema. Gastrointestinal system: Abdomen is nondistended, soft and nontender. No organomegaly or masses felt. Normal bowel sounds heard.   Central nervous system: Alert and oriented. No focal neurological deficits.  Cranial nerves II to XII grossly normal.  Able to perform finger-to-nose test and heel-to-shin test on both sides with no difficulty.  No pronator drift.  Strength intact in all extremities.  Station intact in all extremities.  Face symmetrical. Extremities: Symmetric 5 x 5 power. Skin: No rashes, lesions or ulcers Psychiatry: Judgement and insight appear normal. Mood & affect appropriate.    Data Reviewed: I have personally reviewed  following labs and imaging studies  CBC: Recent Labs  Lab 03/22/19 2204 03/23/19 0958 03/24/19 0605  WBC 9.4 12.4* 9.1  NEUTROABS 6.6  --   --   HGB 14.4 14.4 13.0  HCT 42.8 43.5 39.4  MCV 94.7 95.0 95.2  PLT 283 262 249   Basic Metabolic Panel: Recent Labs  Lab 03/22/19 2204 03/24/19 0605  NA 130* 138  K 3.6 3.9  CL 101 109  CO2 19* 21*  GLUCOSE  120* 99  BUN 20 11  CREATININE 1.38* 0.99  CALCIUM 8.7* 8.5*  MG  --  2.2  PHOS  --  3.5   GFR: Estimated Creatinine Clearance: 97.2 mL/min (by C-G formula based on SCr of 0.99 mg/dL). Liver Function Tests: Recent Labs  Lab 03/22/19 2204 03/24/19 0605  AST 116* 45*  ALT 126* 80*  ALKPHOS 91 74  BILITOT 0.7 1.1  PROT 7.8 6.4*  ALBUMIN 3.8 3.2*   No results for input(s): LIPASE, AMYLASE in the last 168 hours. No results for input(s): AMMONIA in the last 168 hours. Coagulation Profile: Recent Labs  Lab 03/22/19 2204  INR 1.0   Cardiac Enzymes: No results for input(s): CKTOTAL, CKMB, CKMBINDEX, TROPONINI in the last 168 hours. BNP (last 3 results) No results for input(s): PROBNP in the last 8760 hours. HbA1C: No results for input(s): HGBA1C in the last 72 hours. CBG: No results for input(s): GLUCAP in the last 168 hours. Lipid Profile: Recent Labs    03/24/19 0605  CHOL 143  HDL 26*  LDLCALC 88  TRIG 973  CHOLHDL 5.5   Thyroid Function Tests: No results for input(s): TSH, T4TOTAL, FREET4, T3FREE, THYROIDAB in the last 72 hours. Anemia Panel: No results for input(s): VITAMINB12, FOLATE, FERRITIN, TIBC, IRON, RETICCTPCT in the last 72 hours. Sepsis Labs: Recent Labs  Lab 03/22/19 2204  PROCALCITON 0.22  LATICACIDVEN 0.8    Recent Results (from the past 240 hour(s))  Blood Culture (routine x 2)     Status: None (Preliminary result)   Collection Time: 03/22/19 10:04 PM   Specimen: BLOOD RIGHT ARM  Result Value Ref Range Status   Specimen Description   Final    BLOOD RIGHT ARM Performed at [facility], [address] [place], [place] [address]    Special Requests   Final    BOTTLES DRAWN AEROBIC AND ANAEROBIC Blood Culture adequate volume Performed at [facility], [address] [place], [place] [address]    Culture   Final    NO GROWTH 3 DAYS Performed at [facility], [address], [place], [place] [address]     Report Status PENDING  Incomplete  Blood Culture (routine x 2)     Status: None (Preliminary result)   Collection Time: 03/22/19 10:04 PM   Specimen: BLOOD LEFT ARM  Result Value Ref Range Status   Specimen Description   Final    BLOOD LEFT ARM Performed at [facility], [address] [place], [place] [address]    Special Requests   Final    BOTTLES DRAWN AEROBIC AND ANAEROBIC Blood Culture adequate volume Performed at [facility], [address] [place], [place] [address]    Culture   Final    NO GROWTH 3 DAYS Performed at [facility], [address], [place], [place] [address]    Report Status PENDING  Incomplete  Culture, Urine     Status: Abnormal  Collection Time: 03/22/19 10:04 PM   Specimen: Urine, Random  Result Value Ref Range Status   Specimen Description   Final    URINE, RANDOM Performed at [facility], [address] [place], [place] [address]    Special Requests   Final    NONE Performed at [facility], [address] [place], [place] [address]    Culture (A)  Final    >=100,000 COLONIES/mL ESCHERICHIA COLI Confirmed Extended Spectrum Beta-Lactamase Producer (ESBL).  In bloodstream infections from ESBL organisms, carbapenems are preferred over piperacillin/tazobactam. They are shown to have a lower risk of mortality.    Report Status 03/25/2019 FINAL  Final   Organism ID, Bacteria ESCHERICHIA COLI (A)  Final      Susceptibility   Escherichia coli - MIC*    AMPICILLIN >=32 RESISTANT Resistant     CEFAZOLIN >=64 RESISTANT Resistant     CEFTRIAXONE >=64 RESISTANT Resistant     CIPROFLOXACIN >=4 RESISTANT Resistant     GENTAMICIN >=16 RESISTANT Resistant     IMIPENEM <=0.25 SENSITIVE Sensitive     NITROFURANTOIN <=16 SENSITIVE Sensitive     TRIMETH/SULFA >=320 RESISTANT Resistant     AMPICILLIN/SULBACTAM >=32 RESISTANT Resistant     PIP/TAZO 8 SENSITIVE Sensitive     * >=100,000 COLONIES/mL  ESCHERICHIA COLI  SARS Coronavirus 2 Ag (30 min TAT) - Nasopharyngeal Swab     Status: None   Collection Time: 03/23/19  1:40 AM   Specimen: Nasopharyngeal Swab; Nasal Swab  Result Value Ref Range Status   SARS Coronavirus 2 Ag NEGATIVE NEGATIVE Final    Comment: (NOTE) SARS-CoV-2 antigen NOT DETECTED.  Negative results are presumptive.  Negative results do not preclude SARS-CoV-2 infection and should not be used as the sole basis for treatment or other patient management decisions, including infection  control decisions, particularly in the presence of clinical signs and  symptoms consistent with COVID-19, or in those who have been in contact with the virus.  Negative results must be combined with clinical observations, patient history, and epidemiological information. The expected result is Negative. Fact Sheet for Patients: https://sanders-williams.net/ Fact Sheet for Healthcare Providers: https://martinez.com/ This test is not yet approved or cleared by the [place] FDA and  has been authorized for detection and/or diagnosis of SARS-CoV-2 by FDA under an Emergency Use Authorization (EUA).  This EUA will remain in effect (meaning this test can be used) for the duration of  the COVID-19 de claration under Section 564(b)(1) of the Act, 21 U.S.C. section 360bbb-3(b)(1), unless the authorization is terminated or revoked sooner. Performed at [facility], [address] [place], [place] [address]   SARS CORONAVIRUS 2 (TAT 6-24 HRS) Nasopharyngeal Nasopharyngeal Swab     Status: None   Collection Time: 03/23/19  2:50 AM   Specimen: Nasopharyngeal Swab  Result Value Ref Range Status   SARS Coronavirus 2 NEGATIVE NEGATIVE Final    Comment: (NOTE) SARS-CoV-2 target nucleic acids are NOT DETECTED. The SARS-CoV-2 RNA is generally detectable in upper and lower respiratory specimens during the acute phase of infection. Negative results do  not preclude SARS-CoV-2 infection, do not rule out co-infections with other pathogens, and should not be used as the sole basis for treatment or other patient management decisions. Negative results must be combined with clinical observations, patient history, and epidemiological information. The expected result is Negative. Fact Sheet for Patients: HairSlick.no Fact Sheet for Healthcare Providers: quierodirigir.com This test is not yet  approved or cleared by the [place] and  has been authorized for detection and/or diagnosis of SARS-CoV-2 by FDA under an Emergency Use Authorization (EUA). This EUA will remain  in effect (meaning this test can be used) for the duration of the COVID-19 declaration under Section 56 4(b)(1) of the Act, 21 U.S.C. section 360bbb-3(b)(1), unless the authorization is terminated or revoked sooner. Performed at [facility], [place] [address], [place], [place] [address]   MRSA PCR Screening     Status: None   Collection Time: 03/24/19 12:54 AM   Specimen: Nasal Mucosa; Nasopharyngeal  Result Value Ref Range Status   MRSA by PCR NEGATIVE NEGATIVE Final    Comment:        The GeneXpert MRSA Assay (FDA approved for NASAL specimens only), is one component of a comprehensive MRSA colonization surveillance program. It is not intended to diagnose MRSA infection nor to guide or monitor treatment for MRSA infections. Performed at [facility], [place] [address] [place], [place] [address]          Radiology Studies: No results found.      Scheduled Meds: . amLODipine  5 mg Oral Daily  . folic acid  1 mg Oral Daily  . heparin  5,000 Units Subcutaneous Q8H  . metoprolol succinate  50 mg Oral Daily  . multivitamin with minerals  1 tablet Oral Daily  . sodium chloride flush  3 mL Intravenous Q12H  . tamsulosin  0.4 mg Oral BID  . thiamine  100 mg Oral Daily   Or  .  thiamine  100 mg Intravenous Daily  . traZODone  100 mg Oral QHS   Continuous Infusions: . sodium chloride    . sodium chloride 125 mL/hr at 03/26/19 1646  . meropenem (MERREM) IV 1 g (03/26/19 1646)     LOS: 3 days    Time spent: Spent more than 30 minutes in coordinating care for this patient including bedside patient care.   Lucky Cowboy, MD [facility] If 7PM-7AM, please contact night-coverage 03/26/2019, 7:35 PM

## 2019-03-27 ENCOUNTER — Inpatient Hospital Stay (HOSPITAL_COMMUNITY): Payer: BC Managed Care – PPO

## 2019-03-27 MED ORDER — IBUPROFEN 200 MG PO TABS
600.0000 mg | ORAL_TABLET | Freq: Once | ORAL | Status: AC
  Administered 2019-03-27: 600 mg via ORAL
  Filled 2019-03-27: qty 3

## 2019-03-27 MED ORDER — ENOXAPARIN SODIUM 40 MG/0.4ML ~~LOC~~ SOLN
40.0000 mg | SUBCUTANEOUS | Status: DC
  Administered 2019-03-27: 40 mg via SUBCUTANEOUS
  Filled 2019-03-27: qty 0.4

## 2019-03-27 MED ORDER — SODIUM CHLORIDE 0.9 % IV SOLN
1.0000 g | INTRAVENOUS | Status: DC
  Administered 2019-03-28: 1000 mg via INTRAVENOUS
  Filled 2019-03-27: qty 1

## 2019-03-27 NOTE — Progress Notes (Signed)
PHARMACY CONSULT NOTE FOR:  OUTPATIENT  PARENTERAL ANTIBIOTIC THERAPY (OPAT)  Indication: ESBL UTI and possible pyelonephritis Regimen: ertapenem 1gm IV q24h End date: 04/07/2019  IV antibiotic discharge orders are pended. To discharging provider:  please sign these orders via discharge navigator,  Select New Orders & click on the button choice - Manage This Unsigned Work.     Thank you for allowing pharmacy to be a part of this patient's care.  Doreene Eland, PharmD, BCPS.   Work Cell: [phone] 03/27/2019 10:55 AM

## 2019-03-27 NOTE — Progress Notes (Signed)
PROGRESS NOTE    Robert Farmer  JEH:[id number] DOB: 02-06-1971 DOA: 03/22/2019 PCP: Hulan Fess, MD    Brief Narrative:  48 year old male with history of alcohol abuse admitted with UTI  Assessment & Plan:   Principal Problem:   Sepsis secondary to UTI ([place]) Active Problems:   E-coli UTI   HTN (hypertension)   ETOH abuse   Elevated LFTs   Ureteritis   Nonintractable headache  ESBL E. coli UTI CT abdomen pelvis showing bilateral urethritis and cystitis but no CT evidence of pyelonephritis or radiopaque renal stones No prior history of renal stones or recurrent UTIs.  No history of recent travel to [place]. No recent hospitalizations or history of antibiotic use other than Bactrim/ciprofloxacin for this UTI Blood cultures no growth to date Meropenem Q8 hrs starting 03/25/2019.  Duration of antibiotics likely 14 days. Will switch to ertapenem at time of discharge as the frequency is less q24 hrs for ease of administration  OPAT consulted.  Midline placed Patient and wife requesting ID consult given persistent fever on antibiotics. Discussed if he continues to fever spike, will call ID tomorrow. They are in agreement.   Alcohol abuse Counseled on importance of cessation CIWA protocol Thiamine, folic acid  Elevated LFT Not consistent with either alcohol-related hepatitis Acute hepatitis panel negative, HIV negative Likely in setting of sepsis  Hypertension Stable, continue home meds  Headache No obvious neurological deficit.  Suspect caffeine withdrawal headache Continue Fioricet as needed as this was helpful Per patient and wife request, imaging ordered. CT head negative for acute intracranial abnormality    DVT prophylaxis: Heparin SQ Code Status: Full code  Family Communication: Discussed care with patient  Disposition Plan: Pending medical stability, discharge destination Home with [facility] for IV antibiotics    Consultants:    None  Procedures:  None  Antimicrobials:   Meropenem being switched to ertapenem    Subjective: Febrile overnight. Continues to have a headache. No other complains. No urinary complains   Objective: Vitals:   03/27/19 0042 03/27/19 0300 03/27/19 0534 03/27/19 0732  BP: (!) 164/95 (!) 150/111 (!) 155/87 121/79  Pulse: 62 81 92 74  Resp: 17 17 20 16   Temp: 99.9 F (37.7 C) (!) 102.3 F (39.1 C) (!) 102.9 F (39.4 C) 100 F (37.8 C)  TempSrc: Oral Oral Oral Oral  SpO2: 97% 97% 95% 95%  Weight:      Height:        Intake/Output Summary (Last 24 hours) at 03/27/2019 0832 Last data filed at 03/26/2019 1800 Gross per 24 hour  Intake 720 ml  Output --  Net 720 ml   Filed Weights   03/22/19 2139  Weight: 83.9 kg    Examination:  General exam: Appears calm and comfortable  Respiratory system: Clear to auscultation. Respiratory effort normal. Cardiovascular system: S1 & S2 heard, RRR. No JVD. No pedal edema. Gastrointestinal system: Abdomen is nondistended, soft and nontender. No organomegaly or masses felt. Normal bowel sounds heard.  No CVA tenderness Central nervous system: Alert and oriented. No focal neurological deficits.  Cranial nerves II to XII grossly normal.  Able to perform finger-to-nose test and heel-to-shin test on both sides with no difficulty.  No pronator drift.  Strength intact in all extremities.  Station intact in all extremities.  Face symmetrical. Extremities: Symmetric 5 x 5 power. Skin: No rashes, lesions or ulcers Psychiatry: Judgement and insight appear normal. Mood & affect appropriate.    Data Reviewed: I have personally reviewed following  labs and imaging studies  CBC: Recent Labs  Lab 03/22/19 2204 03/23/19 0958 03/24/19 0605  WBC 9.4 12.4* 9.1  NEUTROABS 6.6  --   --   HGB 14.4 14.4 13.0  HCT 42.8 43.5 39.4  MCV 94.7 95.0 95.2  PLT 283 262 249   Basic Metabolic Panel: Recent Labs  Lab 03/22/19 2204 03/24/19 0605  NA  130* 138  K 3.6 3.9  CL 101 109  CO2 19* 21*  GLUCOSE 120* 99  BUN 20 11  CREATININE 1.38* 0.99  CALCIUM 8.7* 8.5*  MG  --  2.2  PHOS  --  3.5   GFR: Estimated Creatinine Clearance: 97.2 mL/min (by C-G formula based on SCr of 0.99 mg/dL). Liver Function Tests: Recent Labs  Lab 03/22/19 2204 03/24/19 0605  AST 116* 45*  ALT 126* 80*  ALKPHOS 91 74  BILITOT 0.7 1.1  PROT 7.8 6.4*  ALBUMIN 3.8 3.2*   No results for input(s): LIPASE, AMYLASE in the last 168 hours. No results for input(s): AMMONIA in the last 168 hours. Coagulation Profile: Recent Labs  Lab 03/22/19 2204  INR 1.0   Cardiac Enzymes: No results for input(s): CKTOTAL, CKMB, CKMBINDEX, TROPONINI in the last 168 hours. BNP (last 3 results) No results for input(s): PROBNP in the last 8760 hours. HbA1C: No results for input(s): HGBA1C in the last 72 hours. CBG: No results for input(s): GLUCAP in the last 168 hours. Lipid Profile: No results for input(s): CHOL, HDL, LDLCALC, TRIG, CHOLHDL, LDLDIRECT in the last 72 hours. Thyroid Function Tests: No results for input(s): TSH, T4TOTAL, FREET4, T3FREE, THYROIDAB in the last 72 hours. Anemia Panel: No results for input(s): VITAMINB12, FOLATE, FERRITIN, TIBC, IRON, RETICCTPCT in the last 72 hours. Sepsis Labs: Recent Labs  Lab 03/22/19 2204  PROCALCITON 0.22  LATICACIDVEN 0.8    Recent Results (from the past 240 hour(s))  Blood Culture (routine x 2)     Status: None (Preliminary result)   Collection Time: 03/22/19 10:04 PM   Specimen: BLOOD RIGHT ARM  Result Value Ref Range Status   Specimen Description   Final    BLOOD RIGHT ARM Performed at [facility], [address] [place], [place] [address]    Special Requests   Final    BOTTLES DRAWN AEROBIC AND ANAEROBIC Blood Culture adequate volume Performed at [facility], [address] [place], [place] [address]    Culture   Final    NO GROWTH 3 DAYS Performed at [facility], [address], [place], [place] [address]    Report Status PENDING  Incomplete  Blood Culture (routine x 2)     Status: None (Preliminary result)   Collection Time: 03/22/19 10:04 PM   Specimen: BLOOD LEFT ARM  Result Value Ref Range Status   Specimen Description   Final    BLOOD LEFT ARM Performed at [facility], [address] [place], [place] [address]    Special Requests   Final    BOTTLES DRAWN AEROBIC AND ANAEROBIC Blood Culture adequate volume Performed at [facility], [address] [place], [place] [address]    Culture   Final    NO GROWTH 3 DAYS Performed at [facility], [address], [place], [place] [address]    Report Status PENDING  Incomplete  Culture, Urine     Status: Abnormal   Collection Time: 03/22/19 10:04 PM   Specimen:  Urine, Random  Result Value Ref Range Status   Specimen Description   Final    URINE, RANDOM Performed at [facility], [address] [place], [place] [address]    Special Requests   Final    NONE Performed at [facility], [address] [place], [place] [address]    Culture (A)  Final    >=100,000 COLONIES/mL ESCHERICHIA COLI Confirmed Extended Spectrum Beta-Lactamase Producer (ESBL).  In bloodstream infections from ESBL organisms, carbapenems are preferred over piperacillin/tazobactam. They are shown to have a lower risk of mortality.    Report Status 03/25/2019 FINAL  Final   Organism ID, Bacteria ESCHERICHIA COLI (A)  Final      Susceptibility   Escherichia coli - MIC*    AMPICILLIN >=32 RESISTANT Resistant     CEFAZOLIN >=64 RESISTANT Resistant     CEFTRIAXONE >=64 RESISTANT Resistant     CIPROFLOXACIN >=4 RESISTANT Resistant     GENTAMICIN >=16 RESISTANT Resistant     IMIPENEM <=0.25 SENSITIVE Sensitive     NITROFURANTOIN <=16 SENSITIVE Sensitive     TRIMETH/SULFA >=320 RESISTANT Resistant     AMPICILLIN/SULBACTAM >=32 RESISTANT Resistant     PIP/TAZO 8  SENSITIVE Sensitive     * >=100,000 COLONIES/mL ESCHERICHIA COLI  SARS Coronavirus 2 Ag (30 min TAT) - Nasopharyngeal Swab     Status: None   Collection Time: 03/23/19  1:40 AM   Specimen: Nasopharyngeal Swab; Nasal Swab  Result Value Ref Range Status   SARS Coronavirus 2 Ag NEGATIVE NEGATIVE Final    Comment: (NOTE) SARS-CoV-2 antigen NOT DETECTED.  Negative results are presumptive.  Negative results do not preclude SARS-CoV-2 infection and should not be used as the sole basis for treatment or other patient management decisions, including infection  control decisions, particularly in the presence of clinical signs and  symptoms consistent with COVID-19, or in those who have been in contact with the virus.  Negative results must be combined with clinical observations, patient history, and epidemiological information. The expected result is Negative. Fact Sheet for Patients: https://sanders-williams.net/ Fact Sheet for Healthcare Providers: https://martinez.com/ This test is not yet approved or cleared by the [place] FDA and  has been authorized for detection and/or diagnosis of SARS-CoV-2 by FDA under an Emergency Use Authorization (EUA).  This EUA will remain in effect (meaning this test can be used) for the duration of  the COVID-19 de claration under Section 564(b)(1) of the Act, 21 U.S.C. section 360bbb-3(b)(1), unless the authorization is terminated or revoked sooner. Performed at [facility], [address] [place], [place] [address]   SARS CORONAVIRUS 2 (TAT 6-24 HRS) Nasopharyngeal Nasopharyngeal Swab     Status: None   Collection Time: 03/23/19  2:50 AM   Specimen: Nasopharyngeal Swab  Result Value Ref Range Status   SARS Coronavirus 2 NEGATIVE NEGATIVE Final    Comment: (NOTE) SARS-CoV-2 target nucleic acids are NOT DETECTED. The SARS-CoV-2 RNA is generally detectable in upper and lower respiratory specimens during the  acute phase of infection. Negative results do not preclude SARS-CoV-2 infection, do not rule out co-infections with other pathogens, and should not be used as the sole basis for treatment or other patient management decisions. Negative results must be combined with clinical observations, patient history, and epidemiological information. The expected result is Negative. Fact Sheet for Patients: HairSlick.no Fact Sheet for Healthcare Providers: quierodirigir.com This test is not yet approved or cleared by the [place] FDA  and  has been authorized for detection and/or diagnosis of SARS-CoV-2 by FDA under an Emergency Use Authorization (EUA). This EUA will remain  in effect (meaning this test can be used) for the duration of the COVID-19 declaration under Section 56 4(b)(1) of the Act, 21 U.S.C. section 360bbb-3(b)(1), unless the authorization is terminated or revoked sooner. Performed at [facility], [address] [place], [place] [address]   MRSA PCR Screening     Status: None   Collection Time: 03/24/19 12:54 AM   Specimen: Nasal Mucosa; Nasopharyngeal  Result Value Ref Range Status   MRSA by PCR NEGATIVE NEGATIVE Final    Comment:        The GeneXpert MRSA Assay (FDA approved for NASAL specimens only), is one component of a comprehensive MRSA colonization surveillance program. It is not intended to diagnose MRSA infection nor to guide or monitor treatment for MRSA infections. Performed at [facility], [address] [place], [place] [address]          Radiology Studies: No results found.      Scheduled Meds: . amLODipine  5 mg Oral Daily  . folic acid  1 mg Oral Daily  . heparin  5,000 Units Subcutaneous Q8H  . metoprolol succinate  50 mg Oral Daily  . multivitamin with minerals  1 tablet Oral Daily  . sodium chloride flush  3 mL Intravenous Q12H  . tamsulosin  0.4 mg Oral  BID  . thiamine  100 mg Oral Daily   Or  . thiamine  100 mg Intravenous Daily  . traZODone  100 mg Oral QHS   Continuous Infusions: . sodium chloride    . sodium chloride 125 mL/hr at 03/26/19 2117  . meropenem (MERREM) IV 1 g (03/27/19 0804)     LOS: 4 days    Time spent: Spent more than 30 minutes in coordinating care for this patient including bedside patient care.   Liborio NixonJanki Y Tamarion Haymond, MD [facility] If 7PM-7AM, please contact night-coverage 03/27/2019, 8:32 AM

## 2019-03-28 LAB — CBC WITH DIFFERENTIAL/PLATELET
Abs Immature Granulocytes: 0.1 10*3/uL — ABNORMAL HIGH (ref 0.00–0.07)
Basophils Absolute: 0.1 10*3/uL (ref 0.0–0.1)
Basophils Relative: 1 %
Eosinophils Absolute: 0.2 10*3/uL (ref 0.0–0.5)
Eosinophils Relative: 1 %
HCT: 45 % (ref 39.0–52.0)
Hemoglobin: 14.6 g/dL (ref 13.0–17.0)
Immature Granulocytes: 1 %
Lymphocytes Relative: 6 %
Lymphs Abs: 0.9 10*3/uL (ref 0.7–4.0)
MCH: 31.3 pg (ref 26.0–34.0)
MCHC: 32.4 g/dL (ref 30.0–36.0)
MCV: 96.6 fL (ref 80.0–100.0)
Monocytes Absolute: 0.7 10*3/uL (ref 0.1–1.0)
Monocytes Relative: 5 %
Neutro Abs: 13.4 10*3/uL — ABNORMAL HIGH (ref 1.7–7.7)
Neutrophils Relative %: 86 %
Platelets: 381 10*3/uL (ref 150–400)
RBC: 4.66 MIL/uL (ref 4.22–5.81)
RDW: 12.4 % (ref 11.5–15.5)
WBC: 15.5 10*3/uL — ABNORMAL HIGH (ref 4.0–10.5)
nRBC: 0 % (ref 0.0–0.2)

## 2019-03-28 LAB — CULTURE, BLOOD (ROUTINE X 2)
Culture: NO GROWTH
Culture: NO GROWTH
Special Requests: ADEQUATE
Special Requests: ADEQUATE

## 2019-03-28 MED ORDER — ACETAMINOPHEN 500 MG PO TABS: 1000.0000 mg | ORAL_TABLET | Freq: Two times a day (BID) | ORAL | 0 refills | Status: AC | PRN

## 2019-03-28 MED ORDER — BUTALBITAL-APAP-CAFFEINE 50-325-40 MG PO TABS: 2.0000 | ORAL_TABLET | Freq: Three times a day (TID) | ORAL | 0 refills | Status: AC | PRN

## 2019-03-28 MED ORDER — TAMSULOSIN HCL 0.4 MG PO CAPS: 0.4000 mg | ORAL_CAPSULE | Freq: Every day | ORAL | 0 refills | Status: AC

## 2019-03-28 MED ORDER — ERTAPENEM IV (FOR PTA / DISCHARGE USE ONLY): 1.0000 g | INTRAVENOUS | 0 refills | Status: AC

## 2019-03-28 MED ORDER — THIAMINE HCL 100 MG PO TABS: 100.0000 mg | ORAL_TABLET | Freq: Every day | ORAL | 0 refills | Status: AC

## 2019-03-28 MED ORDER — AMLODIPINE BESYLATE 5 MG PO TABS: 5.0000 mg | ORAL_TABLET | Freq: Every day | ORAL | 0 refills | Status: AC

## 2019-03-28 NOTE — Progress Notes (Signed)
Patient requested medication for headache and anxiety. Patient reports chills and is visibly shaking. Two heated blankets placed. Coffee provided per request. Temp taken: 98.4. Denies nausea, vomiting, blurred vision, SOB, cough, muscle aches, sore throat, head or nasal congestion, fatigue, or diarrhea. Will continue to monitor.

## 2019-03-28 NOTE — Discharge Summary (Addendum)
Physician Discharge Summary  Robert HobbyBrandon A Farmer ZOX:[id number] DOB: 03/24/1971 DOA: 03/22/2019  PCP: Catha GosselinLittle, Kevin, MD  Admit date: 03/22/2019 Discharge date: 03/28/2019  Admitted From: Home Disposition:  Home  Recommendations for Outpatient Follow-up:  1. Follow up with PCP in 1-2 weeks 2. Home health to administer IV antibiotics - Ertapenem 1g IV Q24 hrs (end date 04/07/19 to complete a total of 14 days). Monitor CBC/CMP once a week.  3. Encourage continued alcohol cessation.  CT A/P imaging findings of hepatic steatosis and borderline splenomegaly.  4. Started on amlodipine 5 mg daily for elevated BP. Monitor BP and adjust dose as needed.  5. He would benefit from an SSRI for anxiety instead of PRN xanax.  6. Started on tamsolusin 0.4 mg qhs this hospitalization for enlarged prostate and risk of urinary stasis.  7. Ensure headache has resolved. PRN fioricet and tylenol provided.   Home Health: Yes Equipment/Devices: IV antibiotics    Discharge Condition: Stable CODE STATUS: Full code Diet recommendation: regular   Brief/Interim Summary: 48 year old male with history of hypertension, alcohol abuse admitted with fever, urinary symptoms and found to have ESBL E. coli UTI.  Blood cultures remain negative for 5 days.  No obvious risk factors noted.  CT abdomen pelvis on 03/22/2019 showed bilateral urethritis and cystitis but no perinephric fat stranding or findings suggestive of pyelonephritis.  No radiopaque stone was found.  Prostate enlarged.  He was initially started on ceftriaxone and vancomycin was added but after culture results, switch to meropenem.  His urinary symptoms resolved and he had no CVA tenderness.  He continued to have fever spike for the first 2 days after starting meropenem which then improved.  He complained of headache only partially relieved with pain medications.  Neuro exam nonfocal.  CT head ordered which showed no acute intracranial abnormality.  Headache thought to  be in setting of caffeine withdrawal, sudden alcohol cessation and infection. He was switched to ertapenem Q24 for ease of administration. Case briefly reviewed with ID pharmacist.  Patient was advised to call for fever more than 101 F or any new symptom including SOB, cough.   Discharge Diagnoses:  Principal Problem:   Sepsis secondary to UTI [facility](HCC) Active Problems:   E-coli UTI   HTN (hypertension)   ETOH abuse   Elevated LFTs   Ureteritis   Nonintractable headache   Discharge Instructions:  Discharge Instructions    Call MD for:  difficulty breathing, headache or visual disturbances   Complete by: As directed    Call MD for:  temperature >100.4   Complete by: As directed    Diet - low sodium heart healthy   Complete by: As directed    Discharge instructions   Complete by: As directed    Your headache medication fioricet has tylenol in it. So sparingly use your tylenol when using fioricet.   Home infusion instructions [facility]; May administer Cathflo as needed to maintain patency of vascular access device.; Flushing of vascular access device[facility] Protocol: 0.9% NaCl pre/post medica...   Complete by: As directed    Instructions[facility]   Instructions: May administer Cathflo as needed to maintain patency of vascular access device.   Instructions: Flushing [facility] vascular [facility]: 0.9% NaCl pre/post medication administration and prn patency; Heparin 100 u/ml, 5ml for implanted ports and Heparin 10u/ml, 5ml for all other central venous catheters.   Instructions: [facility]  Anaphylaxis Protocol for First Dose Administration in the home: 0.9% NaCl at 25-50 ml/hr to maintain IV access for protocol meds. Epinephrine 0.3 ml IV/IM PRN and Benadryl 25-50 IV/IM PRN s/s of anaphylaxis.   Instructions: [facility] Infusion Coordinator (RN) to assist per patient IV care needs in  the home PRN.   Increase activity slowly   Complete by: As directed      Allergies as of 03/28/2019   No Known Allergies     Medication List    STOP taking these medications   ciprofloxacin 250 MG tablet Commonly known as: CIPRO   naproxen 375 MG tablet Commonly known as: NAPROSYN   sulfamethoxazole-trimethoprim 800-160 MG tablet Commonly known as: BACTRIM DS     TAKE these medications   acetaminophen 500 MG tablet Commonly known as: TYLENOL Take 2 tablets (1,000 mg total) by mouth every 12 (twelve) hours as needed for mild pain, fever or headache. What changed:   when to take this  reasons to take this   ALPRAZolam 0.5 MG tablet Commonly known as: XANAX Take 0.5 mg by mouth 2 (two) times daily as needed for anxiety.   amLODipine 5 MG tablet Commonly known as: NORVASC Take 1 tablet (5 mg total) by mouth daily.   butalbital-acetaminophen-caffeine 50-325-40 MG tablet Commonly known as: FIORICET Take 2 tablets by mouth every 8 (eight) hours as needed for headache.   ertapenem  IVPB Commonly known as: INVANZ Inject 1 g into the vein daily for 9 days. Indication: ESBL E. Coli UTI and possible pyelonephritis Last Day of Therapy: 04/07/2019 Labs - Once weekly:  CBC/D and BMP Start taking on: March 29, 2019   ibuprofen 200 MG tablet Commonly known as: ADVIL Take 400 mg by mouth every 6 (six) hours as needed for fever, headache or moderate pain.   metoprolol succinate 50 MG 24 hr tablet Commonly known as: TOPROL-XL Take 50 mg by mouth daily.   polyvinyl alcohol 1.4 % ophthalmic solution Commonly known as: LIQUIFILM TEARS Place 1 drop into both eyes as needed for dry eyes.   tamsulosin 0.4 MG Caps capsule Commonly known as: FLOMAX Take 1 capsule (0.4 mg total) by mouth at bedtime.   thiamine 100 MG tablet Take 1 tablet (100 mg total) by mouth daily.            Home Infusion Instuctions  (From admission, onward)         Start     Ordered    03/28/19 0000  Home infusion instructions [facility] May follow [facility] Dosing Protocol; May administer Cathflo as needed to maintain patency of vascular access device.; Flushing of vascular access device: per [facility] Protocol: 0.9% NaCl pre/post medica...    Question Answer Comment  Instructions May follow [facility] Dosing Protocol   Instructions May administer Cathflo as needed to maintain patency of vascular access device.   Instructions Flushing of vascular access device: per [facility] Protocol: 0.9% NaCl pre/post medication administration and prn patency; Heparin 100 u/ml, 48ml for implanted ports and Heparin 10u/ml, 45ml for all other central venous catheters.   Instructions May follow [facility] Anaphylaxis Protocol for First Dose Administration in the home: 0.9% NaCl at 25-50 ml/hr to maintain IV access for protocol meds. Epinephrine 0.3 ml IV/IM PRN and Benadryl 25-50 IV/IM PRN s/s of anaphylaxis.   Instructions [facility] Infusion Coordinator (RN) to assist per patient IV care needs in the home PRN.      03/28/19 7622  No Known Allergies  Consultations:  None    Procedures/Studies: CT HEAD WO CONTRAST  Result Date: 03/27/2019 CLINICAL DATA:  Headache for multiple days EXAM: CT HEAD WITHOUT CONTRAST TECHNIQUE: Contiguous axial images were obtained from the base of the skull through the vertex without intravenous contrast. COMPARISON:  None. FINDINGS: Brain: There is no acute intracranial hemorrhage, mass-effect, or edema. Gray-white differentiation is preserved. There is no extra-axial fluid collection. Ventricles and sulci are within normal limits in size and configuration. Vascular: No hyperdense vessel or unexpected calcification. Skull: Calvarium is unremarkable. Sinuses/Orbits: No acute finding. Other: None. IMPRESSION: Normal CT of the head Electronically Signed   By: Guadlupe Spanish M.D.   On: 03/27/2019 16:31   CT ABDOMEN PELVIS W CONTRAST  Result Date:  03/22/2019 CLINICAL DATA:  Fever and difficulty urinating since Tuesday. EXAM: CT ABDOMEN AND PELVIS WITH CONTRAST TECHNIQUE: Multidetector CT imaging of the abdomen and pelvis was performed using the standard protocol following bolus administration of intravenous contrast. CONTRAST:  OMNIPAQUE IOHEXOL 300 MG/ML  SOLN COMPARISON:  None. FINDINGS: Lower chest: The lung bases are clear. The heart size is normal. Hepatobiliary: There is decreased hepatic attenuation suggestive of hepatic steatosis. Normal gallbladder.There is no biliary ductal dilation. Pancreas: Normal contours without ductal dilatation. No peripancreatic fluid collection. Spleen: Spleen is borderline enlarged. Adrenals/Urinary Tract: --Adrenal glands: No adrenal hemorrhage. --Right kidney/ureter: There is mild right-sided collecting system dilatation with diffuse wall thickening and enhancement of the entire length of the right ureter. There is surrounding inflammatory changes. --Left kidney/ureter: There is mild collecting system dilatation with diffuse enhancement and wall thickening of the entire left ureter with adjacent fat stranding. --Urinary bladder: The bladder is moderately distended. There is diffuse bladder wall thickening. Stomach/Bowel: --Stomach/Duodenum: No hiatal hernia or other gastric abnormality. Normal duodenal course and caliber. --Small bowel: No dilatation or inflammation. --Colon: No focal abnormality. --Appendix: Normal. Vascular/Lymphatic: Atherosclerotic calcification is present within the non-aneurysmal abdominal aorta, without hemodynamically significant stenosis. --No retroperitoneal lymphadenopathy. --No mesenteric lymphadenopathy. --No pelvic or inguinal lymphadenopathy. Reproductive: The prostate gland is significantly enlarged. Other: No ascites or free air. The abdominal wall is normal. Musculoskeletal. No acute displaced fractures. IMPRESSION: 1. Bilateral ureteritis and cystitis. No definite CT evidence  for pyelonephritis. No radiopaque kidney stones. 2. Markedly enlarged prostate gland. 3. Hepatic steatosis. 4. Borderline splenomegaly. Aortic Atherosclerosis (ICD10-I70.0). Electronically Signed   By: Katherine Mantle M.D.   On: 03/22/2019 23:49   DG Chest Port 1 View  Result Date: 03/22/2019 CLINICAL DATA:  Fever EXAM: PORTABLE CHEST 1 VIEW COMPARISON:  None. FINDINGS: The heart size and mediastinal contours are within normal limits. Both lungs are clear. The visualized skeletal structures are unremarkable. IMPRESSION: No active disease. Electronically Signed   By: Katherine Mantle M.D.   On: 03/22/2019 22:16     Subjective: Overnight had a temperature of 100.4 F. Reports feeling better this morning. Wife on phone. Denies any urinary symptoms. Denies SOB, cough. Plan for discharge today with close PCP follow up.   Discharge Exam: Vitals:   03/28/19 0446 03/28/19 0800  BP: (!) 126/92 (!) 143/88  Pulse: 70 90  Resp: 19 18  Temp: (!) 97.5 F (36.4 C) 98.4 F (36.9 C)  SpO2: 100% 100%   Vitals:   03/27/19 2315 03/28/19 0200 03/28/19 0446 03/28/19 0800  BP:   (!) 126/92 (!) 143/88  Pulse:   70 90  Resp:   19 18  Temp: 99.7 F (37.6 C) 98 F (36.7 C) (!) 97.5  F (36.4 C) 98.4 F (36.9 C)  TempSrc:  Oral  Oral  SpO2:   100% 100%  Weight:      Height:        General: Pt is alert, awake, not in acute distress Cardiovascular: RRR, S1/S2 +, no murmurs Respiratory: CTA bilaterally, no wheezing, no rhonchi Abdominal: Soft, NT, ND, bowel sounds + No CVA tenderness Extremities: no edema, no cyanosis    The results of significant diagnostics from this hospitalization (including imaging, microbiology, ancillary and laboratory) are listed below for reference.     Microbiology: Recent Results (from the past 240 hour(s))  Blood Culture (routine x 2)     Status: None   Collection Time: 03/22/19 10:04 PM   Specimen: BLOOD RIGHT ARM  Result Value Ref Range Status   Specimen  Description   Final    BLOOD RIGHT ARM Performed at [facility], [address] [place], [place] [address]    Special Requests   Final    BOTTLES DRAWN AEROBIC AND ANAEROBIC Blood Culture adequate volume Performed at [facility], [address] [place], [place] [address]    Culture   Final    NO GROWTH 5 DAYS Performed at [facility], [place] [address] [place], [place] [address]    Report Status 03/28/2019 FINAL  Final  Blood Culture (routine x 2)     Status: None   Collection Time: 03/22/19 10:04 PM   Specimen: BLOOD LEFT ARM  Result Value Ref Range Status   Specimen Description   Final    BLOOD LEFT ARM Performed at [facility], [address] [place], [place] [address]    Special Requests   Final    BOTTLES DRAWN AEROBIC AND ANAEROBIC Blood Culture adequate volume Performed at [facility], [address], [place], [place] [address]    Culture   Final    NO GROWTH 5 DAYS Performed at [facility], [place] [address] [place], [place] [address]    Report Status 03/28/2019 FINAL  Final  Culture, Urine     Status: Abnormal   Collection Time: 03/22/19 10:04 PM   Specimen: Urine, Random  Result Value Ref Range Status   Specimen Description   Final    URINE, RANDOM Performed at [facility], [address] [place], [place] [address]    Special Requests   Final    NONE Performed at [facility], [address] [place], [place] [address]    Culture (A)  Final    >=100,000 COLONIES/mL ESCHERICHIA COLI Confirmed Extended Spectrum Beta-Lactamase Producer (ESBL).  In bloodstream infections from ESBL organisms, carbapenems are preferred over piperacillin/tazobactam. They are shown to have a lower risk of mortality.    Report Status 03/25/2019 FINAL  Final   Organism ID, Bacteria ESCHERICHIA COLI (A)  Final      Susceptibility   Escherichia coli - MIC*    AMPICILLIN >=32 RESISTANT Resistant      CEFAZOLIN >=64 RESISTANT Resistant     CEFTRIAXONE >=64 RESISTANT Resistant     CIPROFLOXACIN >=4 RESISTANT Resistant     GENTAMICIN >=16 RESISTANT Resistant     IMIPENEM <=0.25 SENSITIVE Sensitive     NITROFURANTOIN <=16 SENSITIVE Sensitive     TRIMETH/SULFA >=320 RESISTANT Resistant     AMPICILLIN/SULBACTAM >=32 RESISTANT Resistant     PIP/TAZO 8 SENSITIVE Sensitive     * >=100,000  COLONIES/mL ESCHERICHIA COLI  SARS Coronavirus 2 Ag (30 min TAT) - Nasopharyngeal Swab     Status: None   Collection Time: 03/23/19  1:40 AM   Specimen: Nasopharyngeal Swab; Nasal Swab  Result Value Ref Range Status   SARS Coronavirus 2 Ag NEGATIVE NEGATIVE Final    Comment: (NOTE) SARS-CoV-2 antigen NOT DETECTED.  Negative results are presumptive.  Negative results do not preclude SARS-CoV-2 infection and should not be used as the sole basis for treatment or other patient management decisions, including infection  control decisions, particularly in the presence of clinical signs and  symptoms consistent with COVID-19, or in those who have been in contact with the virus.  Negative results must be combined with clinical observations, patient history, and epidemiological information. The expected result is Negative. Fact Sheet for Patients: https://sanders-williams.net/ Fact Sheet for Healthcare Providers: https://martinez.com/ This test is not yet approved or cleared by the [place] FDA and  has been authorized for detection and/or diagnosis of SARS-CoV-2 by FDA under an Emergency Use Authorization (EUA).  This EUA will remain in effect (meaning this test can be used) for the duration of  the COVID-19 de claration under Section 564(b)(1) of the Act, 21 U.S.C. section 360bbb-3(b)(1), unless the authorization is terminated or revoked sooner. Performed at [facility], [address] [place], [place] [address]   SARS CORONAVIRUS 2 (TAT 6-24 HRS)  Nasopharyngeal Nasopharyngeal Swab     Status: None   Collection Time: 03/23/19  2:50 AM   Specimen: Nasopharyngeal Swab  Result Value Ref Range Status   SARS Coronavirus 2 NEGATIVE NEGATIVE Final    Comment: (NOTE) SARS-CoV-2 target nucleic acids are NOT DETECTED. The SARS-CoV-2 RNA is generally detectable in upper and lower respiratory specimens during the acute phase of infection. Negative results do not preclude SARS-CoV-2 infection, do not rule out co-infections with other pathogens, and should not be used as the sole basis for treatment or other patient management decisions. Negative results must be combined with clinical observations, patient history, and epidemiological information. The expected result is Negative. Fact Sheet for Patients: HairSlick.no Fact Sheet for Healthcare Providers: quierodirigir.com This test is not yet approved or cleared by the [place] FDA and  has been authorized for detection and/or diagnosis of SARS-CoV-2 by FDA under an Emergency Use Authorization (EUA). This EUA will remain  in effect (meaning this test can be used) for the duration of the COVID-19 declaration under Section 56 4(b)(1) of the Act, 21 U.S.C. section 360bbb-3(b)(1), unless the authorization is terminated or revoked sooner. Performed at [facility], [address] [place], [place] [address]   MRSA PCR Screening     Status: None   Collection Time: 03/24/19 12:54 AM   Specimen: Nasal Mucosa; Nasopharyngeal  Result Value Ref Range Status   MRSA by PCR NEGATIVE NEGATIVE Final    Comment:        The GeneXpert MRSA Assay (FDA approved for NASAL specimens only), is one component of a comprehensive MRSA colonization surveillance program. It is not intended to diagnose MRSA infection nor to guide or monitor treatment for MRSA infections. Performed at [facility], [address],  [place], [place] [address]      Labs: BNP (last 3 results) No results for input(s): BNP in the last 8760 hours. Basic Metabolic Panel: Recent Labs  Lab 03/22/19 2204 03/24/19 0605  NA 130* 138  K 3.6 3.9  CL 101 109  CO2 19* 21*  GLUCOSE 120* 99  BUN 20 11  CREATININE 1.38* 0.99  CALCIUM 8.7* 8.5*  MG  --  2.2  PHOS  --  3.5   Liver Function Tests: Recent Labs  Lab 03/22/19 2204 03/24/19 0605  AST 116* 45*  ALT 126* 80*  ALKPHOS 91 74  BILITOT 0.7 1.1  PROT 7.8 6.4*  ALBUMIN 3.8 3.2*   No results for input(s): LIPASE, AMYLASE in the last 168 hours. No results for input(s): AMMONIA in the last 168 hours. CBC: Recent Labs  Lab 03/22/19 2204 03/23/19 0958 03/24/19 0605 03/28/19 0820  WBC 9.4 12.4* 9.1 15.5*  NEUTROABS 6.6  --   --  13.4*  HGB 14.4 14.4 13.0 14.6  HCT 42.8 43.5 39.4 45.0  MCV 94.7 95.0 95.2 96.6  PLT 283 262 249 381   Cardiac Enzymes: No results for input(s): CKTOTAL, CKMB, CKMBINDEX, TROPONINI in the last 168 hours. BNP: Invalid input(s): POCBNP CBG: No results for input(s): GLUCAP in the last 168 hours. D-Dimer No results for input(s): DDIMER in the last 72 hours. Hgb A1c No results for input(s): HGBA1C in the last 72 hours. Lipid Profile No results for input(s): CHOL, HDL, LDLCALC, TRIG, CHOLHDL, LDLDIRECT in the last 72 hours. Thyroid function studies No results for input(s): TSH, T4TOTAL, T3FREE, THYROIDAB in the last 72 hours.  Invalid input(s): FREET3 Anemia work up No results for input(s): VITAMINB12, FOLATE, FERRITIN, TIBC, IRON, RETICCTPCT in the last 72 hours. Urinalysis    Component Value Date/Time   COLORURINE YELLOW 03/22/2019 2204   APPEARANCEUR TURBID (A) 03/22/2019 2204   LABSPEC >1.030 (H) 03/22/2019 2204   PHURINE 5.5 03/22/2019 2204   GLUCOSEU NEGATIVE 03/22/2019 2204   HGBUR LARGE (A) 03/22/2019 2204   BILIRUBINUR NEGATIVE 03/22/2019 2204   KETONESUR NEGATIVE 03/22/2019 2204   PROTEINUR >300 (A) 03/22/2019  2204   NITRITE POSITIVE (A) 03/22/2019 2204   LEUKOCYTESUR SMALL (A) 03/22/2019 2204   Sepsis Labs Invalid input(s): PROCALCITONIN,  WBC,  LACTICIDVEN Microbiology Recent Results (from the past 240 hour(s))  Blood Culture (routine x 2)     Status: None   Collection Time: 03/22/19 10:04 PM   Specimen: BLOOD RIGHT ARM  Result Value Ref Range Status   Specimen Description   Final    BLOOD RIGHT ARM Performed at [facility], [address] [place], [place] [address]    Special Requests   Final    BOTTLES DRAWN AEROBIC AND ANAEROBIC Blood Culture adequate volume Performed at [facility], [address] [place], [place] [address]    Culture   Final    NO GROWTH 5 DAYS Performed at [facility], [address], [place], [place] [address]    Report Status 03/28/2019 FINAL  Final  Blood Culture (routine x 2)     Status: None   Collection Time: 03/22/19 10:04 PM   Specimen: BLOOD LEFT ARM  Result Value Ref Range Status   Specimen Description   Final    BLOOD LEFT ARM Performed at [facility], [address] [place], [place] [address]    Special Requests   Final    BOTTLES DRAWN AEROBIC AND ANAEROBIC Blood Culture adequate volume Performed at [facility], [address], [place], [place] [address]    Culture   Final    NO GROWTH 5 DAYS Performed at [facility], [address] [place], [place] [address]    Report  Status 03/28/2019 FINAL  Final  Culture, Urine     Status: Abnormal   Collection Time: 03/22/19 10:04 PM   Specimen: Urine, Random  Result Value Ref Range Status   Specimen Description   Final    URINE, RANDOM Performed at [facility], [address] [place], [place] [address]    Special Requests   Final    NONE Performed at [facility], [address] [place], [place] [address]    Culture (A)  Final    >=100,000 COLONIES/mL ESCHERICHIA COLI Confirmed Extended Spectrum  Beta-Lactamase Producer (ESBL).  In bloodstream infections from ESBL organisms, carbapenems are preferred over piperacillin/tazobactam. They are shown to have a lower risk of mortality.    Report Status 03/25/2019 FINAL  Final   Organism ID, Bacteria ESCHERICHIA COLI (A)  Final      Susceptibility   Escherichia coli - MIC*    AMPICILLIN >=32 RESISTANT Resistant     CEFAZOLIN >=64 RESISTANT Resistant     CEFTRIAXONE >=64 RESISTANT Resistant     CIPROFLOXACIN >=4 RESISTANT Resistant     GENTAMICIN >=16 RESISTANT Resistant     IMIPENEM <=0.25 SENSITIVE Sensitive     NITROFURANTOIN <=16 SENSITIVE Sensitive     TRIMETH/SULFA >=320 RESISTANT Resistant     AMPICILLIN/SULBACTAM >=32 RESISTANT Resistant     PIP/TAZO 8 SENSITIVE Sensitive     * >=100,000 COLONIES/mL ESCHERICHIA COLI  SARS Coronavirus 2 Ag (30 min TAT) - Nasopharyngeal Swab     Status: None   Collection Time: 03/23/19  1:40 AM   Specimen: Nasopharyngeal Swab; Nasal Swab  Result Value Ref Range Status   SARS Coronavirus 2 Ag NEGATIVE NEGATIVE Final    Comment: (NOTE) SARS-CoV-2 antigen NOT DETECTED.  Negative results are presumptive.  Negative results do not preclude SARS-CoV-2 infection and should not be used as the sole basis for treatment or other patient management decisions, including infection  control decisions, particularly in the presence of clinical signs and  symptoms consistent with COVID-19, or in those who have been in contact with the virus.  Negative results must be combined with clinical observations, patient history, and epidemiological information. The expected result is Negative. Fact Sheet for Patients: https://sanders-williams.net/ Fact Sheet for Healthcare Providers: https://martinez.com/ This test is not yet approved or cleared by the [place] FDA and  has been authorized for detection and/or diagnosis of SARS-CoV-2 by FDA under an Emergency Use Authorization  (EUA).  This EUA will remain in effect (meaning this test can be used) for the duration of  the COVID-19 de claration under Section 564(b)(1) of the Act, 21 U.S.C. section 360bbb-3(b)(1), unless the authorization is terminated or revoked sooner. Performed at [facility], [address] [place], [place] [address]   SARS CORONAVIRUS 2 (TAT 6-24 HRS) Nasopharyngeal Nasopharyngeal Swab     Status: None   Collection Time: 03/23/19  2:50 AM   Specimen: Nasopharyngeal Swab  Result Value Ref Range Status   SARS Coronavirus 2 NEGATIVE NEGATIVE Final    Comment: (NOTE) SARS-CoV-2 target nucleic acids are NOT DETECTED. The SARS-CoV-2 RNA is generally detectable in upper and lower respiratory specimens during the acute phase of infection. Negative results do not preclude SARS-CoV-2 infection, do not rule out co-infections with other pathogens, and should not be used as the sole basis for treatment or other patient management decisions. Negative results must be combined with clinical observations, patient history, and epidemiological information. The expected result is Negative.  Fact Sheet for Patients: HairSlick.no Fact Sheet for Healthcare Providers: quierodirigir.com This test is not yet approved or cleared by the [place] FDA and  has been authorized for detection and/or diagnosis of SARS-CoV-2 by FDA under an Emergency Use Authorization (EUA). This EUA will remain  in effect (meaning this test can be used) for the duration of the COVID-19 declaration under Section 56 4(b)(1) of the Act, 21 U.S.C. section 360bbb-3(b)(1), unless the authorization is terminated or revoked sooner. Performed at [facility], [address], [place], [place] [address]   MRSA PCR Screening     Status: None   Collection Time: 03/24/19 12:54 AM   Specimen: Nasal Mucosa; Nasopharyngeal  Result Value Ref Range Status   MRSA by PCR  NEGATIVE NEGATIVE Final    Comment:        The GeneXpert MRSA Assay (FDA approved for NASAL specimens only), is one component of a comprehensive MRSA colonization surveillance program. It is not intended to diagnose MRSA infection nor to guide or monitor treatment for MRSA infections. Performed at [facility], [address] [place], [place] [address]      Time coordinating discharge: Over 30 minutes  SIGNED:   Liborio Nixon, MD  [facility] 03/28/2019, 5:38 PM  If 7PM-7AM, please contact night-coverage

## 2019-03-28 NOTE — Progress Notes (Signed)
Nsg Discharge Note  Admit Date:  03/22/2019 Discharge date: 03/28/2019   Robert Farmer to be D/C'd Home per MD order.  AVS completed.  Copy for chart, and copy for patient signed, and dated. Patient/caregiver able to verbalize understanding.  Discharge Medication: Allergies as of 03/28/2019   No Known Allergies     Medication List    STOP taking these medications   ciprofloxacin 250 MG tablet Commonly known as: CIPRO   naproxen 375 MG tablet Commonly known as: NAPROSYN   sulfamethoxazole-trimethoprim 800-160 MG tablet Commonly known as: BACTRIM DS     TAKE these medications   acetaminophen 500 MG tablet Commonly known as: TYLENOL Take 2 tablets (1,000 mg total) by mouth every 12 (twelve) hours as needed for mild pain, fever or headache. What changed:   when to take this  reasons to take this   ALPRAZolam 0.5 MG tablet Commonly known as: XANAX Take 0.5 mg by mouth 2 (two) times daily as needed for anxiety.   amLODipine 5 MG tablet Commonly known as: NORVASC Take 1 tablet (5 mg total) by mouth daily.   butalbital-acetaminophen-caffeine 50-325-40 MG tablet Commonly known as: FIORICET Take 2 tablets by mouth every 8 (eight) hours as needed for headache.   ertapenem  IVPB Commonly known as: INVANZ Inject 1 g into the vein daily for 9 days. Indication: ESBL E. Coli UTI and possible pyelonephritis Last Day of Therapy: 04/07/2019 Labs - Once weekly:  CBC/D and BMP Start taking on: March 29, 2019   ibuprofen 200 MG tablet Commonly known as: ADVIL Take 400 mg by mouth every 6 (six) hours as needed for fever, headache or moderate pain.   metoprolol succinate 50 MG 24 hr tablet Commonly known as: TOPROL-XL Take 50 mg by mouth daily.   polyvinyl alcohol 1.4 % ophthalmic solution Commonly known as: LIQUIFILM TEARS Place 1 drop into both eyes as needed for dry eyes.   tamsulosin 0.4 MG Caps capsule Commonly known as: FLOMAX Take 1 capsule (0.4 mg total) by  mouth at bedtime.   thiamine 100 MG tablet Take 1 tablet (100 mg total) by mouth daily.            Home Infusion Instuctions  (From admission, onward)         Start     Ordered   03/28/19 0000  Home infusion instructions [facility] May follow [place] Dosing Protocol; May administer Cathflo as needed to maintain patency of vascular access device.; Flushing of vascular access device: per [facility] Protocol: 0.9% NaCl pre/post medica...    Question Answer Comment  Instructions May follow [place] Dosing Protocol   Instructions May administer Cathflo as needed to maintain patency of vascular access device.   Instructions Flushing of vascular access device: per [facility] Protocol: 0.9% NaCl pre/post medication administration and prn patency; Heparin 100 u/ml, 73ml for implanted ports and Heparin 10u/ml, 22ml for all other central venous catheters.   Instructions May follow [facility] Anaphylaxis Protocol for First Dose Administration in the home: 0.9% NaCl at 25-50 ml/hr to maintain IV access for protocol meds. Epinephrine 0.3 ml IV/IM PRN and Benadryl 25-50 IV/IM PRN s/s of anaphylaxis.   Instructions [facility] Infusion Coordinator (RN) to assist per patient IV care needs in the home PRN.      03/28/19 0958          Discharge Assessment: Vitals:   03/28/19 0446 03/28/19 0800  BP: (!) 126/92 (!) 143/88  Pulse: 70 90  Resp: 19 18  Temp: (!) 97.5 F (36.4 C) 98.4 F (36.9 C)  SpO2: 100% 100%   Skin clean, dry and intact without evidence of skin break down, no evidence of skin tears noted. IV catheter discontinued intact. Site without signs and symptoms of complications - no redness or edema noted at insertion site, patient denies c/o pain - only slight tenderness at site.  Dressing with slight pressure applied. Midline catheter in place for home antibiotics. Midline assessed by IV nurse prior to discharge.    D/c Instructions-Education: Discharge instructions given to  patient/family with verbalized understanding. D/c education completed with patient/family including follow up instructions, medication list, d/c activities limitations if indicated, with other d/c instructions as indicated by MD - patient able to verbalize understanding, all questions fully answered. Patient instructed to return to ED, call 911, or call MD for any changes in condition.  Patient escorted via WC, and D/C home via private auto.  Robert Bellow, RN 03/28/2019 11:32 AM

## 2019-03-29 DIAGNOSIS — R7881 Bacteremia: Secondary | ICD-10-CM | POA: Diagnosis not present

## 2019-03-29 DIAGNOSIS — N390 Urinary tract infection, site not specified: Secondary | ICD-10-CM | POA: Diagnosis not present

## 2019-03-29 DIAGNOSIS — A408 Other streptococcal sepsis: Secondary | ICD-10-CM | POA: Diagnosis not present

## 2019-04-01 ENCOUNTER — Other Ambulatory Visit (HOSPITAL_COMMUNITY)
Admission: RE | Admit: 2019-04-01 | Discharge: 2019-04-01 | Disposition: A | Discharge status: Home / Self Care | Payer: BC Managed Care – PPO | Source: Ambulatory Visit | Attending: Internal Medicine | Admitting: Internal Medicine

## 2019-04-01 DIAGNOSIS — B9620 Unspecified Escherichia coli [E. coli] as the cause of diseases classified elsewhere: Secondary | ICD-10-CM | POA: Insufficient documentation

## 2019-04-01 DIAGNOSIS — R7881 Bacteremia: Secondary | ICD-10-CM | POA: Diagnosis not present

## 2019-04-01 DIAGNOSIS — N390 Urinary tract infection, site not specified: Secondary | ICD-10-CM | POA: Insufficient documentation

## 2019-04-01 DIAGNOSIS — A408 Other streptococcal sepsis: Secondary | ICD-10-CM | POA: Diagnosis not present

## 2019-04-01 LAB — BASIC METABOLIC PANEL
Anion gap: 12 (ref 5–15)
BUN: 19 mg/dL (ref 6–20)
CO2: 22 mmol/L (ref 22–32)
Calcium: 9.4 mg/dL (ref 8.9–10.3)
Chloride: 102 mmol/L (ref 98–111)
Creatinine, Ser: 1.07 mg/dL (ref 0.61–1.24)
GFR calc Af Amer: >60 mL/min (ref >60)
GFR calc non Af Amer: >60 mL/min (ref >60)
Glucose, Bld: 106 mg/dL — ABNORMAL HIGH (ref 70–99)
Potassium: 5.2 mmol/L — ABNORMAL HIGH (ref 3.5–5.1)
Sodium: 136 mmol/L (ref 135–145)

## 2019-04-01 LAB — CBC WITH DIFFERENTIAL/PLATELET
Abs Immature Granulocytes: 0.08 10*3/uL — ABNORMAL HIGH (ref 0.00–0.07)
Basophils Absolute: 0.1 10*3/uL (ref 0.0–0.1)
Basophils Relative: 1 %
Eosinophils Absolute: 0.2 10*3/uL (ref 0.0–0.5)
Eosinophils Relative: 2 %
HCT: 46.6 % (ref 39.0–52.0)
Hemoglobin: 15.6 g/dL (ref 13.0–17.0)
Immature Granulocytes: 1 %
Lymphocytes Relative: 12 %
Lymphs Abs: 1.2 10*3/uL (ref 0.7–4.0)
MCH: 32 pg (ref 26.0–34.0)
MCHC: 33.5 g/dL (ref 30.0–36.0)
MCV: 95.5 fL (ref 80.0–100.0)
Monocytes Absolute: 0.9 10*3/uL (ref 0.1–1.0)
Monocytes Relative: 10 %
Neutro Abs: 7 10*3/uL (ref 1.7–7.7)
Neutrophils Relative %: 74 %
Platelets: 621 10*3/uL — ABNORMAL HIGH (ref 150–400)
RBC: 4.88 MIL/uL (ref 4.22–5.81)
RDW: 12.3 % (ref 11.5–15.5)
WBC: 9.5 10*3/uL (ref 4.0–10.5)
nRBC: 0 % (ref 0.0–0.2)

## 2019-04-09 DIAGNOSIS — Z87440 Personal history of urinary (tract) infections: Secondary | ICD-10-CM | POA: Diagnosis not present

## 2019-04-09 DIAGNOSIS — Z23 Encounter for immunization: Secondary | ICD-10-CM | POA: Diagnosis not present

## 2019-04-09 DIAGNOSIS — I10 Essential (primary) hypertension: Secondary | ICD-10-CM | POA: Diagnosis not present

## 2019-04-10 DIAGNOSIS — A408 Other streptococcal sepsis: Secondary | ICD-10-CM | POA: Diagnosis not present

## 2019-04-10 DIAGNOSIS — N390 Urinary tract infection, site not specified: Secondary | ICD-10-CM | POA: Diagnosis not present

## 2019-04-10 DIAGNOSIS — R7881 Bacteremia: Secondary | ICD-10-CM | POA: Diagnosis not present

## 2021-01-10 IMAGING — CT CT ABD-PELV W/ CM
2 of 5 series · 16 of 46 positions shown, 18 images · IV contrast (Omnipaque)
Comparison: None.

CLINICAL DATA: Fever and difficulty urinating since [REDACTED].

EXAM:
CT ABDOMEN AND PELVIS WITH CONTRAST
TECHNIQUE: Multidetector CT imaging of the abdomen and pelvis was performed
using the standard protocol following bolus administration of
intravenous contrast.
CONTRAST:  100mL OMNIPAQUE IOHEXOL 300 MG/ML  SOLN

[Series 2: axial st · axial · 0.74mm/px · z∈[-564,-118]mm · 13 of 101 slices shown, 15 images]
[im 6/101  soft-tissue]
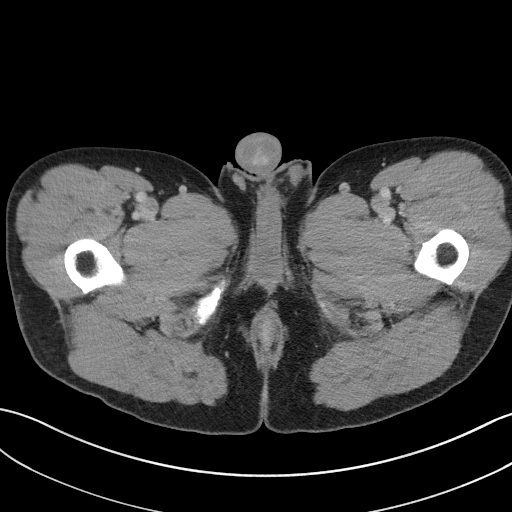
[im 6/101  bone]
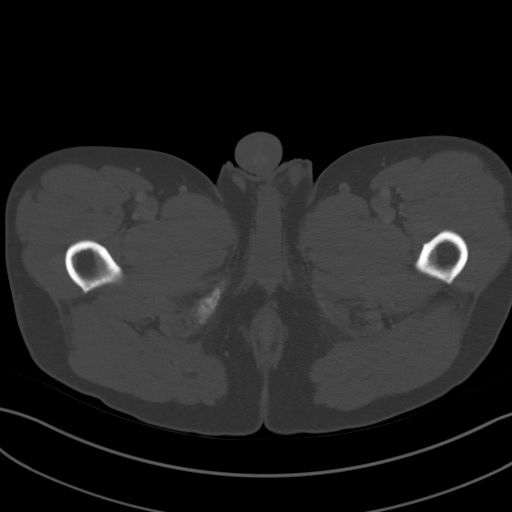
[im 16/101  soft-tissue]
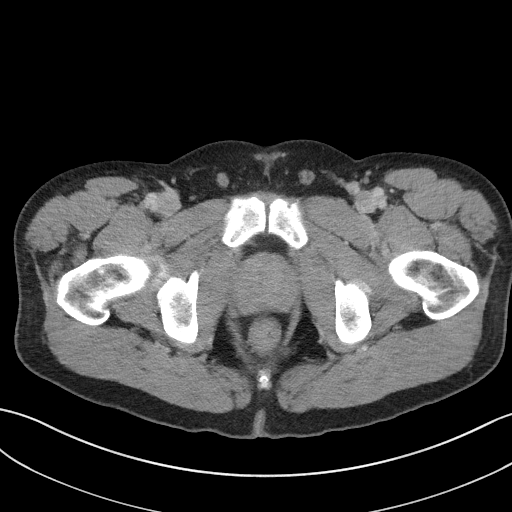
[im 22/101  soft-tissue]
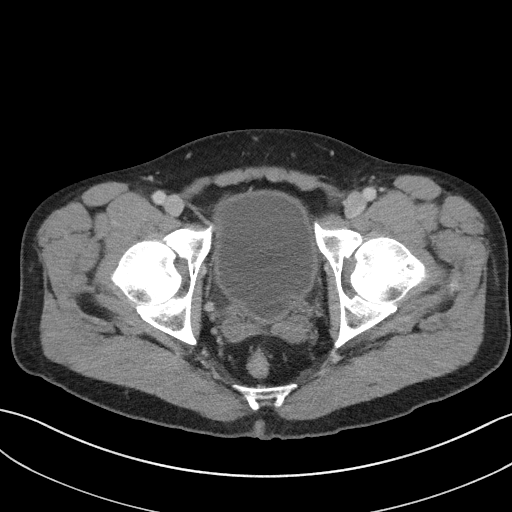
[im 27/101  soft-tissue]
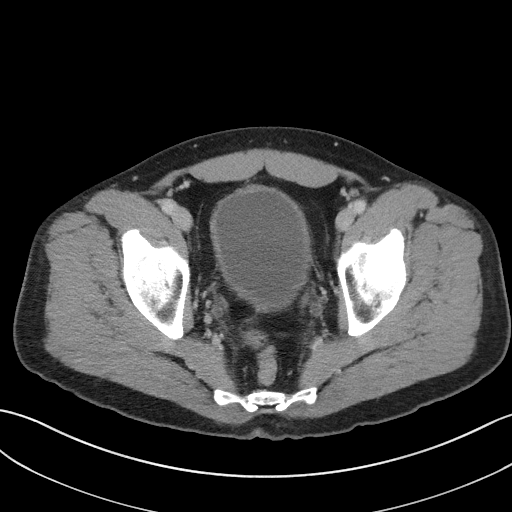
[im 37/101  soft-tissue]
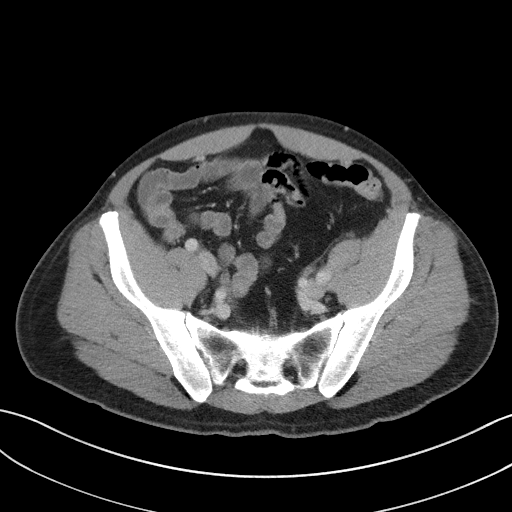
[im 43/101  soft-tissue]
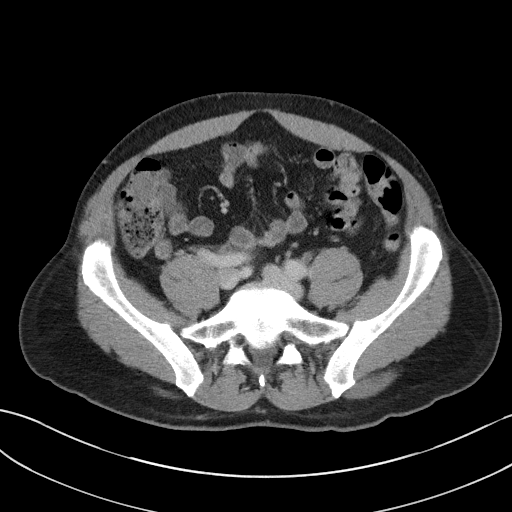
[im 53/101  soft-tissue]
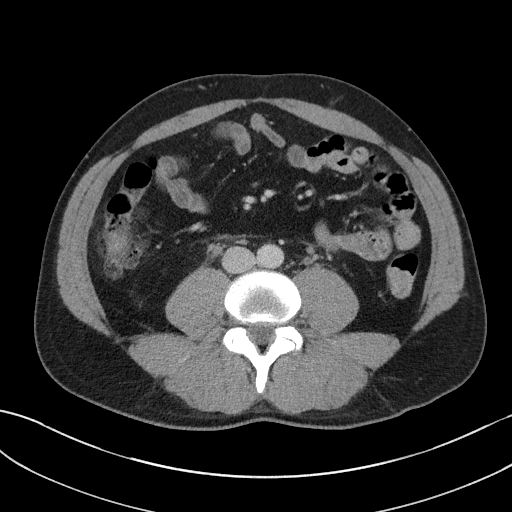
[im 58/101  soft-tissue]
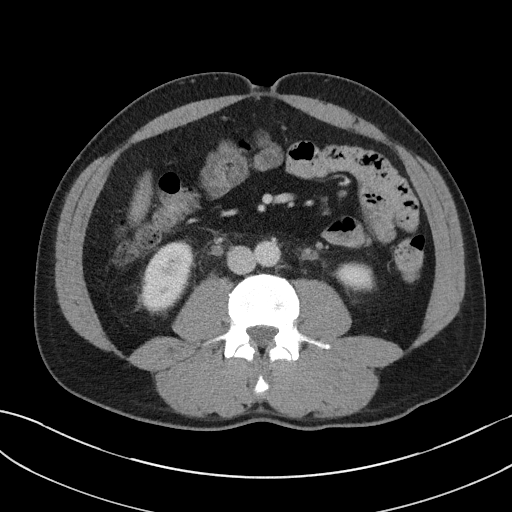
[im 64/101  soft-tissue]
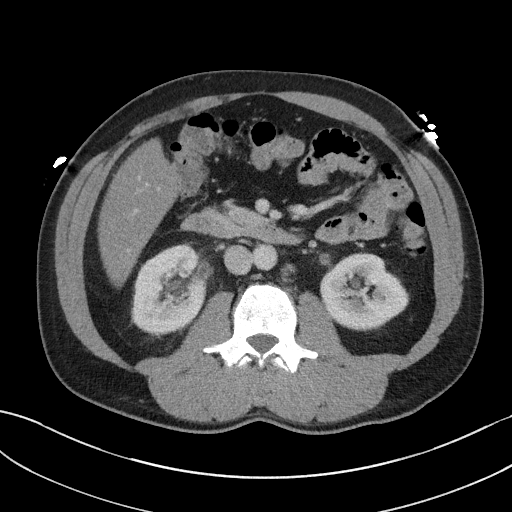
[im 64/101  bone]
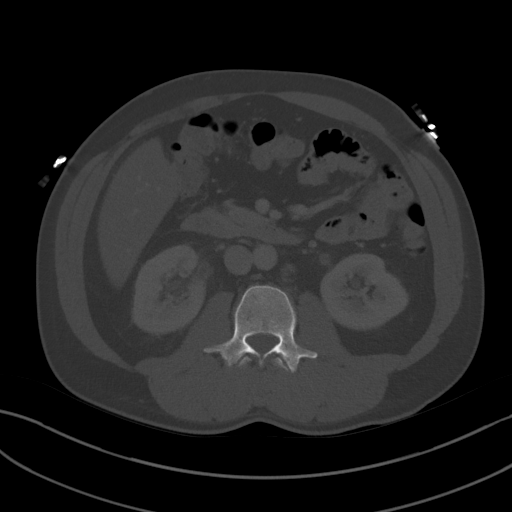
[im 74/101  soft-tissue]
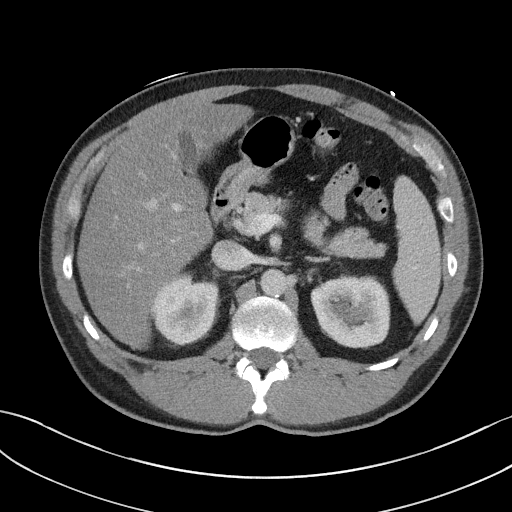
[im 79/101  soft-tissue]
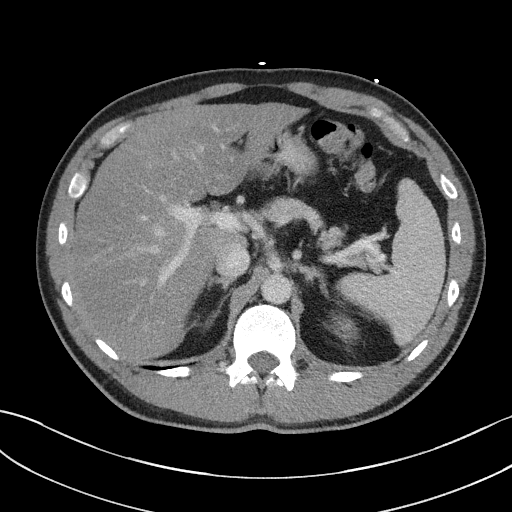
[im 85/101  soft-tissue]
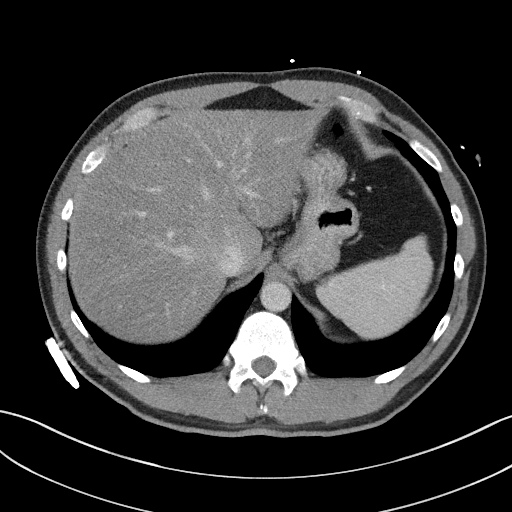
[im 95/101  soft-tissue]
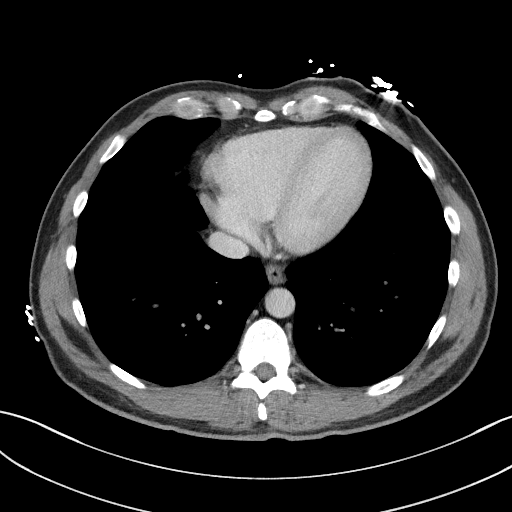

[Series 5: coronal st · coronal · 0.86mm/px · 3 of 90 slices shown]
[im 30/90  soft-tissue]
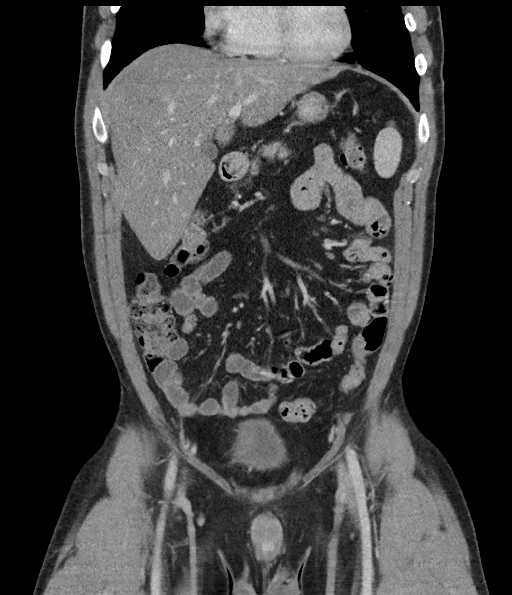
[im 40/90  soft-tissue]
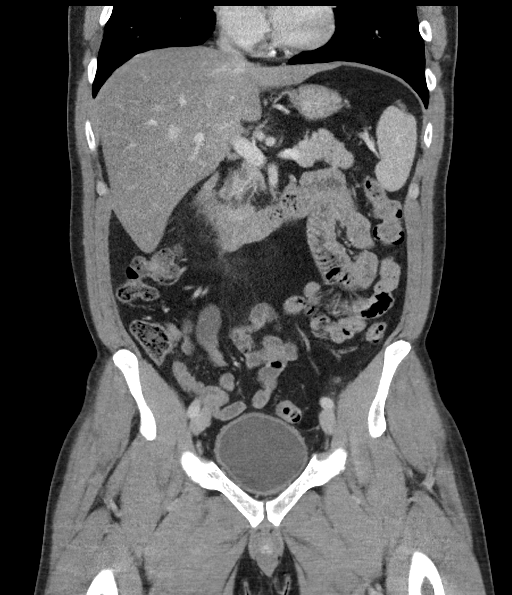
[im 50/90  soft-tissue]
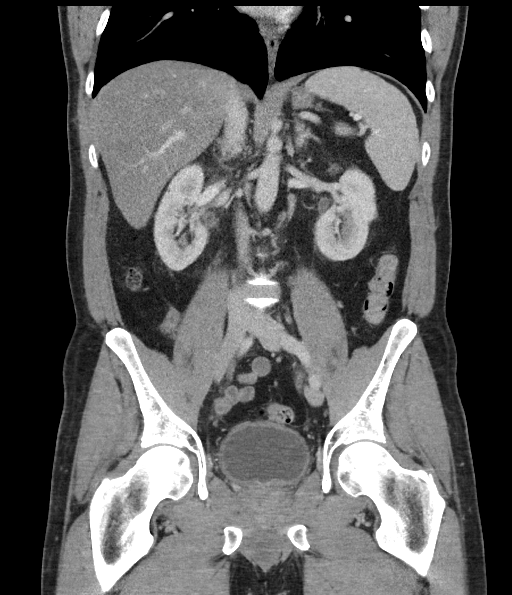

[16 of 46 positions shown; findings below may reference images not displayed]

FINDINGS: Lower chest: The lung bases are clear. The heart size is normal.

Hepatobiliary: There is decreased hepatic attenuation suggestive of
hepatic steatosis. Normal gallbladder.There is no biliary ductal
dilation.

Pancreas: Normal contours without ductal dilatation. No
peripancreatic fluid collection.

Spleen: Spleen is borderline enlarged.

Adrenals/Urinary Tract:

--Adrenal glands: No adrenal hemorrhage.

--Right kidney/ureter: There is mild right-sided collecting system
dilatation with diffuse wall thickening and enhancement of the
entire length of the right ureter. There is surrounding inflammatory
changes.

--Left kidney/ureter: There is mild collecting system dilatation
with diffuse enhancement and wall thickening of the entire left
ureter with adjacent fat stranding.

--Urinary bladder: The bladder is moderately distended. There is
diffuse bladder wall thickening.

Stomach/Bowel:

--Stomach/Duodenum: No hiatal hernia or other gastric abnormality.
Normal duodenal course and caliber.

--Small bowel: No dilatation or inflammation.

--Colon: No focal abnormality.

--Appendix: Normal.

Vascular/Lymphatic: Atherosclerotic calcification is present within
the non-aneurysmal abdominal aorta, without hemodynamically
significant stenosis.

--No retroperitoneal lymphadenopathy.

--No mesenteric lymphadenopathy.

--No pelvic or inguinal lymphadenopathy.

Reproductive: The prostate gland is significantly enlarged.

Other: No ascites or free air. The abdominal wall is normal.

Musculoskeletal. No acute displaced fractures.
IMPRESSION: 1. Bilateral ureteritis and cystitis. No definite CT evidence for
pyelonephritis. No radiopaque kidney stones.
2. Markedly enlarged prostate gland.
3. Hepatic steatosis.
4. Borderline splenomegaly.

Aortic Atherosclerosis ([id number]).

## 2021-01-10 IMAGING — DX DG CHEST 1V PORT
1 series · 1 of 1 positions shown · non-contrast
Comparison: None.

CLINICAL DATA: Fever

EXAM:
PORTABLE CHEST 1 VIEW

[chest ap]
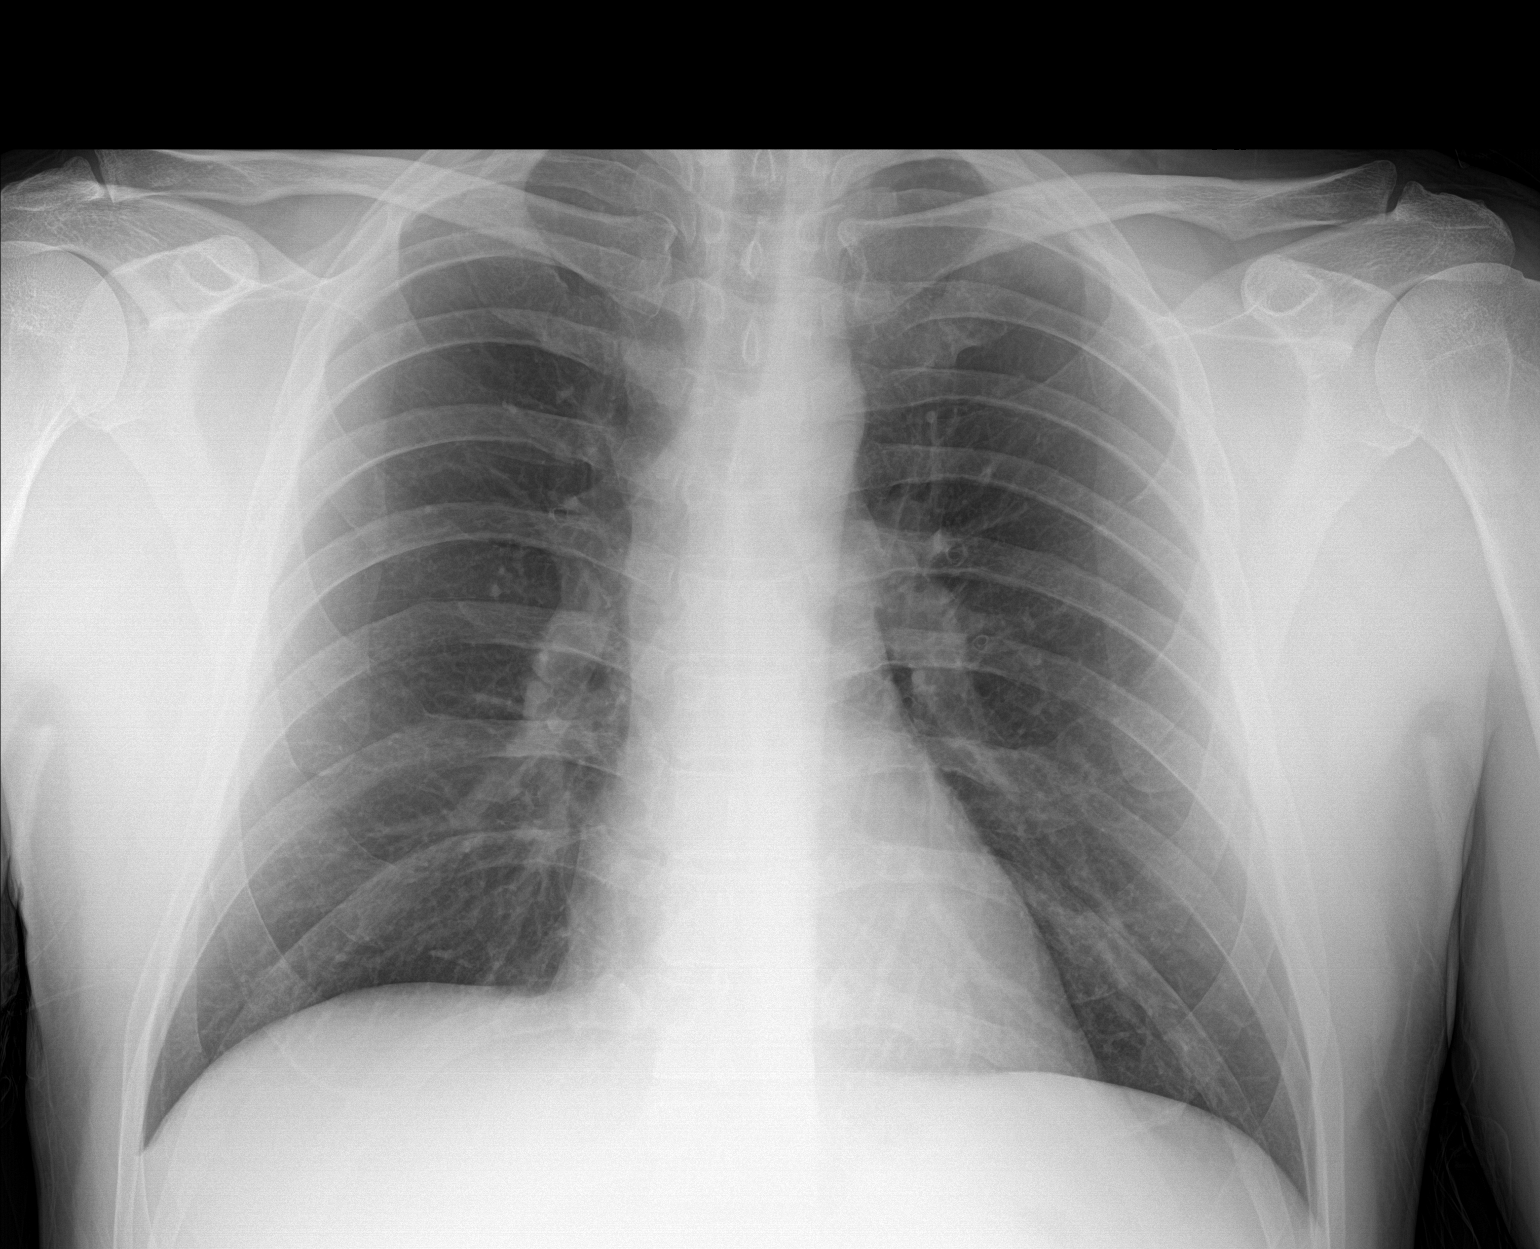

[1 of 1 positions shown; findings below may reference images not displayed]

FINDINGS: The heart size and mediastinal contours are within normal limits.
Both lungs are clear. The visualized skeletal structures are
unremarkable.
IMPRESSION: No active disease.
